# Patient Record
Sex: Female | Born: 1950 | Race: Black or African American | Hispanic: No | Marital: Married | Smoking: Never smoker
Health system: Southern US, Community
[De-identification: ages and names within clinical notes are randomized; demographics above are authoritative.]

## PROBLEM LIST (undated history)

## (undated) DIAGNOSIS — K219 Gastro-esophageal reflux disease without esophagitis: Secondary | ICD-10-CM

## (undated) DIAGNOSIS — R011 Cardiac murmur, unspecified: Secondary | ICD-10-CM

## (undated) DIAGNOSIS — E785 Hyperlipidemia, unspecified: Secondary | ICD-10-CM

## (undated) DIAGNOSIS — E119 Type 2 diabetes mellitus without complications: Secondary | ICD-10-CM

## (undated) DIAGNOSIS — D649 Anemia, unspecified: Secondary | ICD-10-CM

## (undated) DIAGNOSIS — K635 Polyp of colon: Secondary | ICD-10-CM

## (undated) DIAGNOSIS — R519 Headache, unspecified: Secondary | ICD-10-CM

## (undated) DIAGNOSIS — Z87442 Personal history of urinary calculi: Secondary | ICD-10-CM

## (undated) DIAGNOSIS — E213 Hyperparathyroidism, unspecified: Secondary | ICD-10-CM

## (undated) DIAGNOSIS — M1990 Unspecified osteoarthritis, unspecified site: Secondary | ICD-10-CM

## (undated) DIAGNOSIS — I499 Cardiac arrhythmia, unspecified: Secondary | ICD-10-CM

## (undated) DIAGNOSIS — R51 Headache: Secondary | ICD-10-CM

## (undated) DIAGNOSIS — I509 Heart failure, unspecified: Secondary | ICD-10-CM

## (undated) DIAGNOSIS — I10 Essential (primary) hypertension: Secondary | ICD-10-CM

## (undated) DIAGNOSIS — G2581 Restless legs syndrome: Secondary | ICD-10-CM

## (undated) DIAGNOSIS — Z8489 Family history of other specified conditions: Secondary | ICD-10-CM

## (undated) HISTORY — DX: Unspecified osteoarthritis, unspecified site: M19.90

## (undated) HISTORY — PX: TUBAL LIGATION: SHX77

## (undated) HISTORY — DX: Hyperlipidemia, unspecified: E78.5

## (undated) HISTORY — PX: COLONOSCOPY: SHX5424

## (undated) HISTORY — DX: Polyp of colon: K63.5

## (undated) HISTORY — DX: Gastro-esophageal reflux disease without esophagitis: K21.9

## (undated) HISTORY — PX: OTHER SURGICAL HISTORY: SHX169

## (undated) HISTORY — PX: COLONOSCOPY: SHX174

## (undated) HISTORY — DX: Essential (primary) hypertension: I10

---

## 1997-10-02 ENCOUNTER — Encounter: Admission: RE | Admit: 1997-10-02 | Discharge: 1997-10-02 | Payer: Self-pay | Admitting: Family Medicine

## 1997-10-05 ENCOUNTER — Encounter: Admission: RE | Admit: 1997-10-05 | Discharge: 1997-10-05 | Payer: Self-pay | Admitting: Family Medicine

## 1998-02-01 ENCOUNTER — Encounter: Admission: RE | Admit: 1998-02-01 | Discharge: 1998-02-01 | Payer: Self-pay | Admitting: Family Medicine

## 1998-02-08 ENCOUNTER — Encounter: Admission: RE | Admit: 1998-02-08 | Discharge: 1998-02-08 | Payer: Self-pay | Admitting: Family Medicine

## 1998-02-16 ENCOUNTER — Encounter: Admission: RE | Admit: 1998-02-16 | Discharge: 1998-02-16 | Payer: Self-pay | Admitting: Sports Medicine

## 1998-02-18 ENCOUNTER — Encounter: Admission: RE | Admit: 1998-02-18 | Discharge: 1998-02-18 | Payer: Self-pay | Admitting: Family Medicine

## 1998-03-16 ENCOUNTER — Encounter: Admission: RE | Admit: 1998-03-16 | Discharge: 1998-03-16 | Payer: Self-pay | Admitting: Family Medicine

## 2000-05-31 ENCOUNTER — Encounter: Payer: Self-pay | Admitting: Internal Medicine

## 2003-10-12 ENCOUNTER — Encounter: Payer: Self-pay | Admitting: Internal Medicine

## 2003-10-12 LAB — CONVERTED CEMR LAB

## 2003-12-21 LAB — FECAL OCCULT BLOOD, GUAIAC: Fecal Occult Blood: NEGATIVE

## 2003-12-23 ENCOUNTER — Encounter: Admission: RE | Admit: 2003-12-23 | Discharge: 2003-12-23 | Payer: Self-pay | Admitting: Internal Medicine

## 2004-12-23 ENCOUNTER — Ambulatory Visit: Payer: Self-pay | Admitting: Family Medicine

## 2004-12-30 ENCOUNTER — Ambulatory Visit: Payer: Self-pay | Admitting: Internal Medicine

## 2005-07-06 ENCOUNTER — Ambulatory Visit: Payer: Self-pay | Admitting: Internal Medicine

## 2005-07-07 ENCOUNTER — Ambulatory Visit: Payer: Self-pay | Admitting: Internal Medicine

## 2005-07-17 ENCOUNTER — Ambulatory Visit: Payer: Self-pay | Admitting: Internal Medicine

## 2005-07-20 ENCOUNTER — Ambulatory Visit: Payer: Self-pay | Admitting: Internal Medicine

## 2006-02-22 ENCOUNTER — Ambulatory Visit: Payer: Self-pay | Admitting: Internal Medicine

## 2006-02-22 LAB — CONVERTED CEMR LAB: Hgb A1c MFr Bld: 6.2 %

## 2006-03-01 ENCOUNTER — Ambulatory Visit (HOSPITAL_COMMUNITY): Admission: RE | Admit: 2006-03-01 | Discharge: 2006-03-01 | Payer: Self-pay | Admitting: Internal Medicine

## 2006-03-21 ENCOUNTER — Ambulatory Visit: Payer: Self-pay | Admitting: Gastroenterology

## 2006-03-30 ENCOUNTER — Ambulatory Visit: Payer: Self-pay | Admitting: Gastroenterology

## 2006-03-30 ENCOUNTER — Encounter (INDEPENDENT_AMBULATORY_CARE_PROVIDER_SITE_OTHER): Payer: Self-pay | Admitting: Specialist

## 2006-03-30 LAB — HM COLONOSCOPY: HM Colonoscopy: ABNORMAL

## 2006-10-01 ENCOUNTER — Encounter: Payer: Self-pay | Admitting: Internal Medicine

## 2006-10-01 DIAGNOSIS — E785 Hyperlipidemia, unspecified: Secondary | ICD-10-CM | POA: Insufficient documentation

## 2006-10-01 DIAGNOSIS — I10 Essential (primary) hypertension: Secondary | ICD-10-CM

## 2006-10-01 DIAGNOSIS — I059 Rheumatic mitral valve disease, unspecified: Secondary | ICD-10-CM | POA: Insufficient documentation

## 2006-10-01 DIAGNOSIS — M1990 Unspecified osteoarthritis, unspecified site: Secondary | ICD-10-CM | POA: Insufficient documentation

## 2006-10-04 ENCOUNTER — Ambulatory Visit: Payer: Self-pay | Admitting: Internal Medicine

## 2006-10-04 LAB — CONVERTED CEMR LAB
Albumin: 3.7 g/dL (ref 3.5–5.2)
BUN: 15 mg/dL (ref 6–23)
CO2: 31 meq/L (ref 19–32)
Calcium: 10.2 mg/dL (ref 8.4–10.5)
Chloride: 104 meq/L (ref 96–112)
Creatinine, Ser: 0.9 mg/dL (ref 0.4–1.2)
GFR calc Af Amer: 84 mL/min
GFR calc non Af Amer: 69 mL/min
Glucose, Bld: 104 mg/dL — ABNORMAL HIGH (ref 70–99)
Magnesium: 2 mg/dL (ref 1.5–2.5)
Phosphorus: 2.9 mg/dL (ref 2.3–4.6)
Potassium: 4.5 meq/L (ref 3.5–5.1)
Sodium: 140 meq/L (ref 135–145)

## 2007-02-21 ENCOUNTER — Telehealth (INDEPENDENT_AMBULATORY_CARE_PROVIDER_SITE_OTHER): Payer: Self-pay | Admitting: *Deleted

## 2007-08-28 ENCOUNTER — Telehealth (INDEPENDENT_AMBULATORY_CARE_PROVIDER_SITE_OTHER): Payer: Self-pay | Admitting: *Deleted

## 2007-08-29 ENCOUNTER — Ambulatory Visit: Payer: Self-pay | Admitting: Internal Medicine

## 2007-09-02 LAB — CONVERTED CEMR LAB
ALT: 23 units/L (ref 0–35)
AST: 23 units/L (ref 0–37)
Albumin: 3.9 g/dL (ref 3.5–5.2)
Alkaline Phosphatase: 67 units/L (ref 39–117)
BUN: 14 mg/dL (ref 6–23)
Basophils Absolute: 0.1 10*3/uL (ref 0.0–0.1)
Basophils Relative: 1.1 % — ABNORMAL HIGH (ref 0.0–1.0)
Bilirubin, Direct: 0.1 mg/dL (ref 0.0–0.3)
CO2: 31 meq/L (ref 19–32)
Calcium: 10.3 mg/dL (ref 8.4–10.5)
Chloride: 103 meq/L (ref 96–112)
Cholesterol: 227 mg/dL (ref 0–200)
Creatinine, Ser: 1 mg/dL (ref 0.4–1.2)
Direct LDL: 154.5 mg/dL
Eosinophils Absolute: 0.2 10*3/uL (ref 0.0–0.7)
Eosinophils Relative: 4 % (ref 0.0–5.0)
GFR calc Af Amer: 74 mL/min
GFR calc non Af Amer: 61 mL/min
Glucose, Bld: 85 mg/dL (ref 70–99)
HCT: 33.4 % — ABNORMAL LOW (ref 36.0–46.0)
HDL: 47.3 mg/dL (ref >39.0)
Hemoglobin: 11.7 g/dL — ABNORMAL LOW (ref 12.0–15.0)
Lymphocytes Relative: 29.6 % (ref 12.0–46.0)
MCHC: 35.1 g/dL (ref 30.0–36.0)
MCV: 89.7 fL (ref 78.0–100.0)
Monocytes Absolute: 0.5 10*3/uL (ref 0.1–1.0)
Monocytes Relative: 9.3 % (ref 3.0–12.0)
Neutro Abs: 2.6 10*3/uL (ref 1.4–7.7)
Neutrophils Relative %: 56 % (ref 43.0–77.0)
Phosphorus: 2.7 mg/dL (ref 2.3–4.6)
Platelets: 238 10*3/uL (ref 150–400)
Potassium: 3.9 meq/L (ref 3.5–5.1)
RBC: 3.72 M/uL — ABNORMAL LOW (ref 3.87–5.11)
RDW: 13.2 % (ref 11.5–14.6)
Sodium: 140 meq/L (ref 135–145)
TSH: 0.62 microintl units/mL (ref 0.35–5.50)
Total Bilirubin: 0.9 mg/dL (ref 0.3–1.2)
Total CHOL/HDL Ratio: 4.8
Total Protein: 7.1 g/dL (ref 6.0–8.3)
Triglycerides: 99 mg/dL (ref 0–149)
VLDL: 20 mg/dL (ref 0–40)
WBC: 4.9 10*3/uL (ref 4.5–10.5)

## 2007-09-10 ENCOUNTER — Ambulatory Visit: Payer: Self-pay

## 2007-09-10 ENCOUNTER — Encounter: Payer: Self-pay | Admitting: Internal Medicine

## 2007-09-25 ENCOUNTER — Encounter: Payer: Self-pay | Admitting: Internal Medicine

## 2007-11-27 ENCOUNTER — Ambulatory Visit: Payer: Self-pay | Admitting: Internal Medicine

## 2007-11-27 DIAGNOSIS — D649 Anemia, unspecified: Secondary | ICD-10-CM | POA: Insufficient documentation

## 2007-11-27 LAB — CONVERTED CEMR LAB
Basophils Absolute: 0 10*3/uL (ref 0.0–0.1)
Basophils Relative: 0.7 % (ref 0.0–3.0)
Eosinophils Absolute: 0.2 10*3/uL (ref 0.0–0.7)
Eosinophils Relative: 4.6 % (ref 0.0–5.0)
HCT: 35.6 % — ABNORMAL LOW (ref 36.0–46.0)
Hemoglobin: 12.4 g/dL (ref 12.0–15.0)
Lymphocytes Relative: 37.8 % (ref 12.0–46.0)
MCHC: 34.8 g/dL (ref 30.0–36.0)
MCV: 89.7 fL (ref 78.0–100.0)
Monocytes Absolute: 0.5 10*3/uL (ref 0.1–1.0)
Monocytes Relative: 10.2 % (ref 3.0–12.0)
Neutro Abs: 2.2 10*3/uL (ref 1.4–7.7)
Neutrophils Relative %: 46.7 % (ref 43.0–77.0)
Platelets: 278 10*3/uL (ref 150–400)
RBC: 3.97 M/uL (ref 3.87–5.11)
RDW: 13.3 % (ref 11.5–14.6)
WBC: 4.6 10*3/uL (ref 4.5–10.5)

## 2008-01-24 ENCOUNTER — Ambulatory Visit (HOSPITAL_COMMUNITY): Admission: RE | Admit: 2008-01-24 | Discharge: 2008-01-24 | Payer: Self-pay | Admitting: Internal Medicine

## 2008-01-24 LAB — HM MAMMOGRAPHY: HM Mammogram: NORMAL

## 2008-01-28 ENCOUNTER — Encounter (INDEPENDENT_AMBULATORY_CARE_PROVIDER_SITE_OTHER): Payer: Self-pay | Admitting: *Deleted

## 2009-01-19 ENCOUNTER — Ambulatory Visit: Payer: Self-pay | Admitting: Internal Medicine

## 2009-01-21 LAB — CONVERTED CEMR LAB
ALT: 23 units/L (ref 0–35)
AST: 21 units/L (ref 0–37)
Albumin: 4 g/dL (ref 3.5–5.2)
Alkaline Phosphatase: 65 units/L (ref 39–117)
BUN: 10 mg/dL (ref 6–23)
Basophils Absolute: 0 10*3/uL (ref 0.0–0.1)
Basophils Relative: 1 % (ref 0.0–3.0)
Bilirubin, Direct: 0.1 mg/dL (ref 0.0–0.3)
CO2: 30 meq/L (ref 19–32)
Calcium: 10.6 mg/dL — ABNORMAL HIGH (ref 8.4–10.5)
Chloride: 103 meq/L (ref 96–112)
Creatinine, Ser: 0.9 mg/dL (ref 0.4–1.2)
Eosinophils Absolute: 0.2 10*3/uL (ref 0.0–0.7)
Eosinophils Relative: 5.3 % — ABNORMAL HIGH (ref 0.0–5.0)
GFR calc non Af Amer: 82.8 mL/min (ref >60)
Glucose, Bld: 158 mg/dL — ABNORMAL HIGH (ref 70–99)
HCT: 36.2 % (ref 36.0–46.0)
Hemoglobin: 12.2 g/dL (ref 12.0–15.0)
Lymphocytes Relative: 29.9 % (ref 12.0–46.0)
Lymphs Abs: 1.2 10*3/uL (ref 0.7–4.0)
MCHC: 33.8 g/dL (ref 30.0–36.0)
MCV: 91.8 fL (ref 78.0–100.0)
Monocytes Absolute: 0.4 10*3/uL (ref 0.1–1.0)
Monocytes Relative: 9.3 % (ref 3.0–12.0)
Neutro Abs: 2.3 10*3/uL (ref 1.4–7.7)
Neutrophils Relative %: 54.5 % (ref 43.0–77.0)
Phosphorus: 3.3 mg/dL (ref 2.3–4.6)
Platelets: 223 10*3/uL (ref 150.0–400.0)
Potassium: 4.2 meq/L (ref 3.5–5.1)
RBC: 3.94 M/uL (ref 3.87–5.11)
RDW: 13.7 % (ref 11.5–14.6)
Sodium: 140 meq/L (ref 135–145)
TSH: 0.76 microintl units/mL (ref 0.35–5.50)
Total Bilirubin: 1 mg/dL (ref 0.3–1.2)
Total Protein: 7.4 g/dL (ref 6.0–8.3)
WBC: 4.1 10*3/uL — ABNORMAL LOW (ref 4.5–10.5)

## 2009-01-22 ENCOUNTER — Ambulatory Visit: Payer: Self-pay | Admitting: Internal Medicine

## 2009-01-25 LAB — CONVERTED CEMR LAB
Calcium, Total (PTH): 10.3 mg/dL (ref 8.4–10.5)
PTH: 145.3 pg/mL — ABNORMAL HIGH (ref 14.0–72.0)

## 2009-01-26 LAB — CONVERTED CEMR LAB
Glucose, Bld: 107 mg/dL — ABNORMAL HIGH (ref 70–99)
Hgb A1c MFr Bld: 6.3 % (ref 4.6–6.5)

## 2009-02-12 ENCOUNTER — Ambulatory Visit: Payer: Self-pay | Admitting: Internal Medicine

## 2009-02-12 DIAGNOSIS — E210 Primary hyperparathyroidism: Secondary | ICD-10-CM

## 2009-02-17 ENCOUNTER — Ambulatory Visit: Payer: Self-pay | Admitting: Internal Medicine

## 2009-02-17 ENCOUNTER — Encounter: Payer: Self-pay | Admitting: Internal Medicine

## 2009-11-08 ENCOUNTER — Ambulatory Visit: Payer: Self-pay | Admitting: Internal Medicine

## 2009-11-09 ENCOUNTER — Telehealth: Payer: Self-pay | Admitting: Internal Medicine

## 2010-04-14 NOTE — Assessment & Plan Note (Signed)
Summary: blood pressure/alc   Vital Signs:  Patient profile:   60 year old female Weight:      183 pounds BMI:     33.06 Temp:     98.6 degrees F oral Pulse rate:   60 / minute Pulse rhythm:   regular BP sitting:   120 / 60  (left arm) Cuff size:   regular  Vitals Entered By: Mervin Hack CMA Duncan Dull) (November 08, 2009 4:39 PM) CC: pain in the back of head   History of Present Illness: Doing okay No new concerns except she gets the sense of her heart beating on side of head---right temple Occurs if she is rushing around No headaches No vfision change no pain during chewing  No palpitations in chest (unless she misses BP meds) no skips now Occ tingling sensation in chest--"like sticking with a needle" No SOB  No edema  Pain in left shoulder some decreased abduction tylenol and advil help  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 Lisinopril (Lisinopril)  Past History:  Past medical, surgical, family and social histories (including risk factors) reviewed for relevance to current acute and chronic problems.  Past Medical History: Reviewed history from 10/04/2006 and no changes required. Hyperlipidemia Hypertension Mitral valve prolapse Glucose intolerance Osteoarthritis Hyperplastic colon polyp  Past Surgical History: Reviewed history from 11/27/2007 and no changes required. Denies surgical history  Family History: Father: Died age age 46, DM, HTN Mother: Alive with HTN Siblings: 6 Brothers, 2 alive DM. 3 Sisters, one alive DM 1 brother bled from cerebral aneurysm CAD on both sides HTN also DM in Dad/ daughter, sibs. No breast or colon cancer  Social History: Reviewed history from 10/01/2006 and no changes required. Marital Status: Married Children: 3 Occupation: Leisure centre manager houses Never Smoked Alcohol use-no  Review of Systems       hearing seems okay in right ear no tinnitus  Physical Exam  General:  alert and normal  appearance.   Head:  no temporal bruits normocephalic and atraumatic.   Ears:  R ear normal and L ear normal.   Neck:  supple, no masses, no thyromegaly, no carotid bruits, and no cervical lymphadenopathy.   Lungs:  normal respiratory effort, no intercostal retractions, no accessory muscle use, and normal breath sounds.   Heart:  normal rate, regular rhythm, no murmur, and no gallop.   Extremities:  no edema Psych:  normally interactive, good eye contact, not anxious appearing, and not depressed appearing.     Impression & Recommendations:  Problem # 1:  PALPITATIONS (ICD-785.1) Assessment Comment Only feels it in head will research the issue of familial aneurysm and whether screening is appropriate  Her updated medication list for this problem includes:    Atenolol 100 Mg Tabs (Atenolol) .Marland Kitchen... Take one by mouth daily  Problem # 2:  OSTEOARTHRITIS (ICD-715.90) Assessment: Unchanged OTC meds help  Her updated medication list for this problem includes:    Ibuprofen 400 Mg Tabs (Ibuprofen) .Marland Kitchen... Take one by mouth three times a day as needed  Problem # 3:  HYPERTENSION (ICD-401.9) Assessment: Unchanged good control no changes needed  Her updated medication list for this problem includes:    Atenolol 100 Mg Tabs (Atenolol) .Marland Kitchen... Take one by mouth daily    Triamterene-hctz 37.5-25 Mg Tabs (Triamterene-hctz) .Marland Kitchen... Take one by mouth daily  BP today: 120/60 Prior BP: 110/70 (02/12/2009)  Labs Reviewed: K+: 4.2 (01/19/2009) Creat: : 0.9 (01/19/2009)   Chol: 227 (08/29/2007)  HDL: 47.3 (08/29/2007)   LDL: DEL (08/29/2007)   TG: 99 (08/29/2007)  Complete Medication List: 1)  Atenolol 100 Mg Tabs (Atenolol) .... Take one by mouth daily 2)  Triamterene-hctz 37.5-25 Mg Tabs (Triamterene-hctz) .... Take one by mouth daily 3)  Ibuprofen 400 Mg Tabs (Ibuprofen) .... Take one by mouth three times a day as needed  Patient Instructions: 1)  Please schedule a follow-up appointment in 6  months for physical  Current Allergies (reviewed today): ! LISINOPRIL (LISINOPRIL)

## 2010-04-14 NOTE — Progress Notes (Signed)
Summary: Questions  Phone Note Outgoing Call   Summary of Call: Please call patient Her symptoms of the pounding are not considered a symptom of an unruptured aneurysm in the brain.  Having one family member with an aneurysm is not considered a high enough risk to screen for aneurysms. Only if there are 2 or more family members is the risk considered to be high enough. Having surgery for an aneurysm is fairly risky itself, so screening is only recommended in the highest risk situations. The symptoms of an aneurysm are generaly a severe headache, or sometimes neurologic symptoms like facial weakness or change in vision Initial call taken by: Cindee Salt MD,  November 09, 2009 8:28 AM  Follow-up for Phone Call        left message on machine at home for patient to return my call.  DeShannon Smith CMA [facility] Dull)  November 09, 2009 11:58 AM   left message on machine at home for patient to return my call.  work number is wrong number. looked in [employer] and found a cell number but the voicemail has not been set up yet. DeShannon Smith CMA [facility] Dull)  November 10, 2009 8:51 AM   pt returned my call and left message for me to call her back, message was left at 9:12am, pt stated on message I needed to call her back before 9:30. I called again and a female family member answered and he told me to call back around 1:00pm. DeShannon Smith CMA [facility] Dull)  November 10, 2009 10:57 AM   left message on machine at home for patient to return my call.  DeShannon Smith CMA (AAMA)  November 10, 2009 1:22 PM   Patient called back and I advised results. She completely understood. Follow-up by: Mervin Hack CMA (AAMA),  November 11, 2009 8:16 AM

## 2010-05-10 ENCOUNTER — Other Ambulatory Visit: Payer: Self-pay | Admitting: Internal Medicine

## 2010-05-10 ENCOUNTER — Ambulatory Visit (INDEPENDENT_AMBULATORY_CARE_PROVIDER_SITE_OTHER): Payer: Self-pay | Admitting: Internal Medicine

## 2010-05-10 ENCOUNTER — Encounter: Payer: Self-pay | Admitting: Internal Medicine

## 2010-05-10 DIAGNOSIS — R079 Chest pain, unspecified: Secondary | ICD-10-CM

## 2010-05-10 LAB — RENAL FUNCTION PANEL
Albumin: 3.9 g/dL (ref 3.5–5.2)
BUN: 16 mg/dL (ref 6–23)
CO2: 30 mEq/L (ref 19–32)
Calcium: 10.5 mg/dL (ref 8.4–10.5)
Chloride: 102 mEq/L (ref 96–112)
Creatinine, Ser: 1 mg/dL (ref 0.4–1.2)
GFR: 73.84 mL/min (ref >60.00)
Glucose, Bld: 134 mg/dL — ABNORMAL HIGH (ref 70–99)
Phosphorus: 2.9 mg/dL (ref 2.3–4.6)
Potassium: 4.1 mEq/L (ref 3.5–5.1)
Sodium: 138 mEq/L (ref 135–145)

## 2010-05-10 LAB — CBC WITH DIFFERENTIAL/PLATELET
Basophils Absolute: 0 10*3/uL (ref 0.0–0.1)
Basophils Relative: 0.6 % (ref 0.0–3.0)
Eosinophils Absolute: 0.2 10*3/uL (ref 0.0–0.7)
Eosinophils Relative: 4.7 % (ref 0.0–5.0)
HCT: 35.4 % — ABNORMAL LOW (ref 36.0–46.0)
Hemoglobin: 12.2 g/dL (ref 12.0–15.0)
Lymphocytes Relative: 28.8 % (ref 12.0–46.0)
Lymphs Abs: 1.4 10*3/uL (ref 0.7–4.0)
MCHC: 34.4 g/dL (ref 30.0–36.0)
MCV: 90.4 fl (ref 78.0–100.0)
Monocytes Absolute: 0.5 10*3/uL (ref 0.1–1.0)
Monocytes Relative: 9.9 % (ref 3.0–12.0)
Neutro Abs: 2.8 10*3/uL (ref 1.4–7.7)
Neutrophils Relative %: 56 % (ref 43.0–77.0)
Platelets: 236 10*3/uL (ref 150.0–400.0)
RBC: 3.92 Mil/uL (ref 3.87–5.11)
RDW: 14.7 % — ABNORMAL HIGH (ref 11.5–14.6)
WBC: 5 10*3/uL (ref 4.5–10.5)

## 2010-05-10 LAB — HEPATIC FUNCTION PANEL
ALT: 27 U/L (ref 0–35)
AST: 23 U/L (ref 0–37)
Albumin: 3.9 g/dL (ref 3.5–5.2)
Alkaline Phosphatase: 67 U/L (ref 39–117)
Bilirubin, Direct: 0.1 mg/dL (ref 0.0–0.3)
Total Bilirubin: 0.7 mg/dL (ref 0.3–1.2)
Total Protein: 6.7 g/dL (ref 6.0–8.3)

## 2010-05-10 LAB — TSH: TSH: 0.69 u[IU]/mL (ref 0.35–5.50)

## 2010-05-11 ENCOUNTER — Ambulatory Visit (HOSPITAL_COMMUNITY)
Admission: RE | Admit: 2010-05-11 | Discharge: 2010-05-11 | Disposition: A | Discharge status: Home / Self Care | Payer: Self-pay | Source: Ambulatory Visit | Attending: Internal Medicine | Admitting: Internal Medicine

## 2010-05-11 DIAGNOSIS — R109 Unspecified abdominal pain: Secondary | ICD-10-CM | POA: Insufficient documentation

## 2010-05-11 DIAGNOSIS — R079 Chest pain, unspecified: Secondary | ICD-10-CM | POA: Insufficient documentation

## 2010-05-19 NOTE — Assessment & Plan Note (Signed)
Summary: heartburn/alc   Vital Signs:  Patient profile:   60 year old female Weight:      188 pounds Temp:     98.5 degrees F oral Pulse rate:   54 / minute Pulse rhythm:   regular BP sitting:   131 / 77  (left arm) Cuff size:   large  Vitals Entered By: Mervin Hack CMA [facility] Dull) (May 10, 2010 8:51 AM) CC: heartburn   History of Present Illness: Having "heartburn" for 2-3 months Gets spells all across chest and into back has also felt pressure into her face Pain comes and goes "like labor pains" Kept her up 2 nights ago  Has had  ~monthly spells usually night but one in the day Crampy type across chest and back next day will have diarrhea No fever No clear food that are related  Tried tums without any help No sig nausea this past time---did at other times. No vomiting though  Allergies: 1)  ! Lisinopril (Lisinopril)  Past History:  Past medical, surgical, family and social histories (including risk factors) reviewed for relevance to current acute and chronic problems.  Past Medical History: Reviewed history from 10/04/2006 and no changes required. Hyperlipidemia Hypertension Mitral valve prolapse Glucose intolerance Osteoarthritis Hyperplastic colon polyp  Past Surgical History: Reviewed history from 11/27/2007 and no changes required. Denies surgical history  Family History: Reviewed history from 11/08/2009 and no changes required. Father: Died age age 39, DM, HTN Mother: Alive with HTN Siblings: 6 Brothers, 2 alive DM. 3 Sisters, one alive DM 1 brother bled from cerebral aneurysm CAD on both sides HTN also DM in Dad/ daughter, sibs. No breast or colon cancer  Social History: Reviewed history from 10/01/2006 and no changes required. Marital Status: Married Children: 3 Occupation: Leisure centre manager houses Never Smoked Alcohol use-no  Review of Systems       No cough  No SOB No burning sensation  Physical Exam  General:  alert and normal  appearance.   Neck:  supple, no masses, and no cervical lymphadenopathy.   Chest Wall:  no tenderness and no mass.   Lungs:  normal respiratory effort, no intercostal retractions, no accessory muscle use, and normal breath sounds.   Heart:  normal rate, regular rhythm, no murmur, and no gallop.   Abdomen:  soft, non-tender, no masses, no hepatomegaly, and no splenomegaly.  Murphy's sign absent Extremities:  no edema Psych:  normally interactive, good eye contact, not anxious appearing, and not depressed appearing.     Impression & Recommendations:  Problem # 1:  CHEST PAIN (ICD-786.50) Assessment New  sounds like colicky type pain suspect gall bladder could be acid related though nothing to suggest cardiopulmonary etiology  P:  will check labs      check RUQ ultrasound      if gallbladder normal, will try ranitidine for a while      if abnormal, surgery in [place]  Orders: Venipuncture ([address]) TLB-Renal Function Panel (80069-RENAL) TLB-CBC Platelet - w/Differential (85025-CBCD) TLB-Hepatic/Liver Function Pnl (80076-HEPATIC) TLB-TSH (Thyroid Stimulating Hormone) (60454-UJW) Radiology Referral (Radiology)  Complete Medication List: 1)  Atenolol 100 Mg Tabs (Atenolol) .... Take one by mouth daily 2)  Triamterene-hctz 37.5-25 Mg Tabs (Triamterene-hctz) .... Take one by mouth daily  Patient Instructions: 1)  Please schedule a follow-up appointment in 3 months .  2)  Please set up gallbladder ultrasound   Orders Added: 1)  Venipuncture [36415] 2)  TLB-Renal Function Panel [80069-RENAL] 3)  TLB-CBC Platelet - w/Differential [85025-CBCD] 4)  TLB-Hepatic/Liver  Function Pnl [80076-HEPATIC] 5)  TLB-TSH (Thyroid Stimulating Hormone) [84443-TSH] 6)  Est. Patient Level IV [[address]] 7)  Radiology Referral [Radiology]    Current Allergies (reviewed today): ! LISINOPRIL (LISINOPRIL)

## 2010-07-19 ENCOUNTER — Other Ambulatory Visit: Payer: Self-pay | Admitting: *Deleted

## 2010-07-19 MED ORDER — TRIAMTERENE-HCTZ 37.5-25 MG PO TABS: 1.0000 | ORAL_TABLET | Freq: Every day | ORAL | Status: DC

## 2010-08-03 ENCOUNTER — Encounter: Payer: Self-pay | Admitting: Internal Medicine

## 2010-08-09 ENCOUNTER — Ambulatory Visit (INDEPENDENT_AMBULATORY_CARE_PROVIDER_SITE_OTHER): Payer: Self-pay | Admitting: Internal Medicine

## 2010-08-09 ENCOUNTER — Encounter: Payer: Self-pay | Admitting: Internal Medicine

## 2010-08-09 DIAGNOSIS — I10 Essential (primary) hypertension: Secondary | ICD-10-CM

## 2010-08-09 DIAGNOSIS — Z0289 Encounter for other administrative examinations: Secondary | ICD-10-CM

## 2010-08-10 NOTE — Progress Notes (Signed)
  Subjective:    Patient ID: Sandra [name], female    DOB: [birth year], 60 y.o.   MRN: [id number]  HPI  No show for appt  Review of Systems     Objective:   Physical Exam        Assessment & Plan:

## 2010-08-10 NOTE — Progress Notes (Signed)
No show visit This needs to be cancelled

## 2010-09-27 ENCOUNTER — Other Ambulatory Visit: Payer: Self-pay | Admitting: *Deleted

## 2010-09-27 MED ORDER — ATENOLOL 100 MG PO TABS: 100.0000 mg | ORAL_TABLET | Freq: Every day | ORAL | Status: DC

## 2010-09-27 NOTE — Telephone Encounter (Signed)
rx sent to pharmacy by e-script  

## 2010-10-17 ENCOUNTER — Other Ambulatory Visit: Payer: Self-pay | Admitting: *Deleted

## 2010-10-17 MED ORDER — TRIAMTERENE-HCTZ 37.5-25 MG PO TABS: 1.0000 | ORAL_TABLET | Freq: Every day | ORAL | Status: DC

## 2010-10-28 ENCOUNTER — Other Ambulatory Visit: Payer: Self-pay | Admitting: *Deleted

## 2010-10-28 MED ORDER — ATENOLOL 100 MG PO TABS: 100.0000 mg | ORAL_TABLET | Freq: Every day | ORAL | Status: DC

## 2011-06-05 ENCOUNTER — Encounter: Payer: Self-pay | Admitting: Internal Medicine

## 2011-06-05 ENCOUNTER — Ambulatory Visit (INDEPENDENT_AMBULATORY_CARE_PROVIDER_SITE_OTHER): Payer: Self-pay | Admitting: Internal Medicine

## 2011-06-05 VITALS — BP 120/80 | HR 59 | Temp 98.4°F | Ht 62.0 in | Wt 186.0 lb

## 2011-06-05 DIAGNOSIS — M23302 Other meniscus derangements, unspecified lateral meniscus, unspecified knee: Secondary | ICD-10-CM

## 2011-06-05 DIAGNOSIS — I10 Essential (primary) hypertension: Secondary | ICD-10-CM

## 2011-06-05 DIAGNOSIS — R0789 Other chest pain: Secondary | ICD-10-CM | POA: Insufficient documentation

## 2011-06-05 NOTE — Assessment & Plan Note (Signed)
Seems to be chest wall related No action needed

## 2011-06-05 NOTE — Assessment & Plan Note (Signed)
Right knee Symptoms are mild Discussed continuing the tylenol and using elastic brace To ortho if worsens

## 2011-06-05 NOTE — Progress Notes (Signed)
  Subjective:    Patient ID: Sandra [name], female    DOB: [birth year], 61 y.o.   MRN: [id number]  HPI Having trouble with right knee Lots of pain Has swollen quite a bit Doesn't remember any injury Swelling posteriorly if she walks for a while Pain when she kneels  Started 5-6 days ago Has tried multiple OTC meds-- aleve and advil no help (they put her to sleep) Tylenol arthritis helps some Using compression wrap also  BP has been okay Checks at pharmacy--usually ~120/80 No headaches Some chest pressure since last week also--"like something laying on top of " sternum Stopped this week Mostly at night in bed---better if she turned onto her stomach No SOB, nausea or diaphoresis with this May last 10 minutes  Current Outpatient Prescriptions on File Prior to Visit  Medication Sig Dispense Refill  . atenolol (TENORMIN) 100 MG tablet Take 1 tablet (100 mg total) by mouth daily.  30 tablet  11  . triamterene-hydrochlorothiazide (MAXZIDE-25) 37.5-25 MG per tablet Take 1 tablet by mouth daily.  90 tablet  1    Allergies  Allergen Reactions  . Lisinopril     REACTION: Angioedema    Past Medical History  Diagnosis Date  . Hyperlipidemia   . Hypertension   . Arthritis   . Mitral valve prolapse   . Glucose intolerance (impaired glucose tolerance)   . Hyperplastic colon polyp     No past surgical history on file.  Family History  Problem Relation Age of Onset  . Hypertension Mother   . Diabetes Father   . Hypertension Father   . Cancer Neg Hx     no breast or colon cancer  . Diabetes Brother   . Diabetes Brother   . Diabetes Sister     History   Social History  . Marital Status: Married    Spouse Name: N/A    Number of Children: 3  . Years of Education: N/A   Occupational History  . clean houses    Social History Main Topics  . Smoking status: Never Smoker   . Smokeless tobacco: Never Used  . Alcohol Use: No  . Drug Use: Not on file  . Sexually Active:  Not on file   Other Topics Concern  . Not on file   Social History Narrative  . No narrative on file   Review of Systems Chronic sleep problem--no change Stays active cleaning houses---no chest pain or easy fatigue with this     Objective:   Physical Exam  Constitutional: She appears well-developed and well-nourished. No distress.  Neck: Normal range of motion. Neck supple. No thyromegaly present.  Cardiovascular: Normal rate, regular rhythm and normal heart sounds.  Exam reveals no gallop and no friction rub.   No murmur heard. Pulmonary/Chest: Effort normal and breath sounds normal. No respiratory distress. She has no wheezes. She has no rales. She exhibits tenderness.       Sternal pressure recreates the chest pain  Musculoskeletal: She exhibits no edema.       Mild effusion in right knee No ligament instability Macmurray's positive with lateral twist---medial negative Normal ROM  Lymphadenopathy:    She has no cervical adenopathy.  Psychiatric: She has a normal mood and affect. Her behavior is normal.          Assessment & Plan:

## 2011-06-05 NOTE — Assessment & Plan Note (Signed)
BP Readings from Last 3 Encounters:  06/05/11 120/80  05/10/10 131/77  11/08/09 120/60   Control is fine Due for labs

## 2011-06-05 NOTE — Patient Instructions (Signed)
Please continue the tylenol arthritis and try a compression brace for your knee. Call for orthopedic referral if worsens

## 2011-06-06 LAB — CBC WITH DIFFERENTIAL/PLATELET
Basophils Absolute: 0.1 10*3/uL (ref 0.0–0.1)
Basophils Relative: 1.1 % (ref 0.0–3.0)
Eosinophils Absolute: 0.2 10*3/uL (ref 0.0–0.7)
Eosinophils Relative: 3.8 % (ref 0.0–5.0)
HCT: 37 % (ref 36.0–46.0)
Hemoglobin: 12.6 g/dL (ref 12.0–15.0)
Lymphocytes Relative: 32.7 % (ref 12.0–46.0)
Lymphs Abs: 1.7 10*3/uL (ref 0.7–4.0)
MCHC: 33.9 g/dL (ref 30.0–36.0)
MCV: 89.8 fl (ref 78.0–100.0)
Monocytes Absolute: 0.4 10*3/uL (ref 0.1–1.0)
Monocytes Relative: 7.4 % (ref 3.0–12.0)
Neutro Abs: 2.9 10*3/uL (ref 1.4–7.7)
Neutrophils Relative %: 55 % (ref 43.0–77.0)
Platelets: 242 10*3/uL (ref 150.0–400.0)
RBC: 4.12 Mil/uL (ref 3.87–5.11)
RDW: 14.1 % (ref 11.5–14.6)
WBC: 5.3 10*3/uL (ref 4.5–10.5)

## 2011-06-06 LAB — BASIC METABOLIC PANEL
BUN: 14 mg/dL (ref 6–23)
CO2: 27 mEq/L (ref 19–32)
Calcium: 10.9 mg/dL — ABNORMAL HIGH (ref 8.4–10.5)
Chloride: 102 mEq/L (ref 96–112)
Creatinine, Ser: 0.9 mg/dL (ref 0.4–1.2)
GFR: 87.73 mL/min (ref >60.00)
Glucose, Bld: 92 mg/dL (ref 70–99)
Potassium: 4 mEq/L (ref 3.5–5.1)
Sodium: 138 mEq/L (ref 135–145)

## 2011-06-06 LAB — LIPID PANEL
Cholesterol: 222 mg/dL — ABNORMAL HIGH (ref 0–200)
HDL: 51.4 mg/dL
Total CHOL/HDL Ratio: 4
Triglycerides: 144 mg/dL (ref 0.0–149.0)
VLDL: 28.8 mg/dL (ref 0.0–40.0)

## 2011-06-06 LAB — HEPATIC FUNCTION PANEL
ALT: 24 U/L (ref 0–35)
AST: 25 U/L (ref 0–37)
Albumin: 4.1 g/dL (ref 3.5–5.2)
Alkaline Phosphatase: 64 U/L (ref 39–117)
Bilirubin, Direct: 0.1 mg/dL (ref 0.0–0.3)
Total Bilirubin: 0.6 mg/dL (ref 0.3–1.2)
Total Protein: 7.7 g/dL (ref 6.0–8.3)

## 2011-06-06 LAB — LDL CHOLESTEROL, DIRECT: Direct LDL: 154.3 mg/dL

## 2011-06-06 LAB — TSH: TSH: 0.51 u[IU]/mL (ref 0.35–5.50)

## 2011-06-07 ENCOUNTER — Encounter: Payer: Self-pay | Admitting: *Deleted

## 2011-08-30 ENCOUNTER — Ambulatory Visit: Payer: Self-pay | Admitting: Family Medicine

## 2011-09-07 ENCOUNTER — Ambulatory Visit (INDEPENDENT_AMBULATORY_CARE_PROVIDER_SITE_OTHER): Payer: Self-pay | Admitting: Family Medicine

## 2011-09-07 ENCOUNTER — Encounter: Payer: Self-pay | Admitting: Family Medicine

## 2011-09-07 VITALS — BP 130/78 | HR 68 | Temp 98.4°F | Ht 62.5 in | Wt 185.8 lb

## 2011-09-07 DIAGNOSIS — M7061 Trochanteric bursitis, right hip: Secondary | ICD-10-CM

## 2011-09-07 DIAGNOSIS — M76899 Other specified enthesopathies of unspecified lower limb, excluding foot: Secondary | ICD-10-CM

## 2011-09-07 NOTE — Progress Notes (Signed)
   Nature conservation officer at [facility] [address] [place] [address] Phone: [phone] Fax: [phone]   Patient Name: Sandra [name] Date of Birth: November 24, 1950 Age: 61 y.o. Medical Record Number: [id number] Gender: female Date of Encounter: 09/07/2011  History of Present Illness:  Sandra [name] is a 61 y.o. very pleasant female patient who presents with the following:  R hip, will start in the hip and will start hurting in the leg and also mostly laterally on the R side.  The pain is all on her lateral RIGHT hip mostly around her trochanteric bursa, and it will radiate some on only a ladder last checked a little bit beyond this distally. No pain in her back. No pain in her posterior buttocks. No pain in hergroin. She is not having any numbness, tingling, or any weakness or other radicular symptoms.  Knee not as bad as it was. The patient saw Dr. Alphonsus Sias a few months ago, and her RIGHT knee was bothering her, but now it really is not.   Past Medical History, Surgical History, Social History, Family History, Problem List, Medications, and Allergies have been reviewed and updated if relevant.  Prior to Admission medications   Medication Sig Start Date End Date Taking? Authorizing Provider  atenolol (TENORMIN) 100 MG tablet Take 1 tablet (100 mg total) by mouth daily. 10/28/10   Karie Schwalbe, MD  triamterene-hydrochlorothiazide (MAXZIDE-25) 37.5-25 MG per tablet Take 1 tablet by mouth daily. 10/17/10 10/17/11  Karie Schwalbe, MD    Review of Systems:  GEN: No fevers, chills. Nontoxic. Primarily MSK c/o today. MSK: Detailed in the HPI GI: tolerating PO intake without difficulty Neuro: No numbness, parasthesias, or tingling associated. Otherwise the pertinent positives of the ROS are noted above.    Physical Examination: Filed Vitals:   09/07/11 0815  BP: 130/78  Pulse: 68  Temp: 98.4 F (36.9 C)   Filed Vitals:   09/07/11 0815  Height: 5' 2.5" (1.588 m)  Weight:  185 lb 12.8 oz (84.278 kg)   Body mass index is 33.44 kg/(m^2). Ideal Body Weight: Weight in (lb) to have BMI = 25: 138.6    GEN: WDWN, NAD, Non-toxic, Alert & Oriented x 3 HEENT: Atraumatic, Normocephalic.  Ears and Nose: No external deformity. EXTR: No clubbing/cyanosis/edema NEURO: Normal gait.  PSYCH: Normally interactive. Conversant. Not depressed or anxious appearing.  Calm demeanor.   HIP EXAM: SIDE: b ROM: Abduction, Flexion, Internal and External range of motion: full Pain with terminal IROM and EROM: none GTB: R marked pain SLR: NEG Knees: No effusion FABER: NT REVERSE FABER: NT, neg Piriformis: NT at direct palpation Str: flexion: 5/5 abduction: 4/5 on right adduction: 5/5 Strength testing non-tender     Assessment and Plan: 1. Trochanteric bursitis of right hip     I reviewed with the patient the structures involved and how they related to diagnosis.   Patient was given a rehabilitation protocol for basic str and ROM, particularly hip abductors  Trochanteric Bursitis Injection, RIGHT Verbal consent obtained. Risks (including infection, potential atrophy), benefits, and alternatives reviewed. Greater trochanter sterilely prepped with Chloraprep. Ethyl Chloride used for anesthesia. 8 cc of Lidocaine 1% injected with 2 cc of 40 mg Depo-Medrol into trochanteric bursa at area of maximal tenderness at greater trochanter. Needle taken to bone to troch bursa, flows easily. Bursa massaged. No bleeding and no complications. Decreased pain after injection. Needle: 22 gauge spinal needle   Hannah Beat, MD

## 2011-09-07 NOTE — Patient Instructions (Addendum)
Trochanteric Bursa Rehab  Stretches: Cross Leg, "sink stretch" - can do all day during work day [employer] over [employer], laying down, hold 5-10 sec x 3  Hip Abduction - build to 3 sets of 30 and then add weights begin with no weight. When 3 x 30 reached, Add 2 lb. ankle weight. Increase to 3,4,5,6 when 3x30 achieved.

## 2011-09-24 ENCOUNTER — Other Ambulatory Visit: Payer: Self-pay | Admitting: Internal Medicine

## 2011-10-20 ENCOUNTER — Other Ambulatory Visit: Payer: Self-pay | Admitting: Internal Medicine

## 2011-11-22 ENCOUNTER — Other Ambulatory Visit: Payer: Self-pay | Admitting: Internal Medicine

## 2012-01-12 ENCOUNTER — Encounter: Payer: Self-pay | Admitting: Internal Medicine

## 2012-08-10 ENCOUNTER — Encounter (HOSPITAL_COMMUNITY): Payer: Self-pay | Admitting: *Deleted

## 2012-08-10 ENCOUNTER — Emergency Department (HOSPITAL_COMMUNITY)
Admission: EM | Admit: 2012-08-10 | Discharge: 2012-08-11 | Disposition: A | Discharge status: Home / Self Care | Payer: Self-pay | Attending: Emergency Medicine | Admitting: Emergency Medicine

## 2012-08-10 DIAGNOSIS — E119 Type 2 diabetes mellitus without complications: Secondary | ICD-10-CM

## 2012-08-10 DIAGNOSIS — Z8709 Personal history of other diseases of the respiratory system: Secondary | ICD-10-CM | POA: Insufficient documentation

## 2012-08-10 DIAGNOSIS — Z85038 Personal history of other malignant neoplasm of large intestine: Secondary | ICD-10-CM | POA: Insufficient documentation

## 2012-08-10 DIAGNOSIS — R739 Hyperglycemia, unspecified: Secondary | ICD-10-CM

## 2012-08-10 DIAGNOSIS — E1169 Type 2 diabetes mellitus with other specified complication: Secondary | ICD-10-CM | POA: Insufficient documentation

## 2012-08-10 DIAGNOSIS — I10 Essential (primary) hypertension: Secondary | ICD-10-CM | POA: Insufficient documentation

## 2012-08-10 DIAGNOSIS — Z79899 Other long term (current) drug therapy: Secondary | ICD-10-CM | POA: Insufficient documentation

## 2012-08-10 DIAGNOSIS — Z8639 Personal history of other endocrine, nutritional and metabolic disease: Secondary | ICD-10-CM | POA: Insufficient documentation

## 2012-08-10 DIAGNOSIS — Z862 Personal history of diseases of the blood and blood-forming organs and certain disorders involving the immune mechanism: Secondary | ICD-10-CM | POA: Insufficient documentation

## 2012-08-10 DIAGNOSIS — Z8679 Personal history of other diseases of the circulatory system: Secondary | ICD-10-CM | POA: Insufficient documentation

## 2012-08-10 LAB — COMPREHENSIVE METABOLIC PANEL WITH GFR
ALT: 32 U/L (ref 0–35)
AST: 26 U/L (ref 0–37)
CO2: 27 meq/L (ref 19–32)
Calcium: 11.6 mg/dL — ABNORMAL HIGH (ref 8.4–10.5)
GFR calc non Af Amer: 55 mL/min — ABNORMAL LOW (ref >90)
Sodium: 128 meq/L — ABNORMAL LOW (ref 135–145)
Total Protein: 8 g/dL (ref 6.0–8.3)

## 2012-08-10 LAB — CBC WITH DIFFERENTIAL/PLATELET
Basophils Absolute: 0 K/uL (ref 0.0–0.1)
Basophils Relative: 0 % (ref 0–1)
Eosinophils Absolute: 0.2 10*3/uL (ref 0.0–0.7)
Eosinophils Relative: 3 % (ref 0–5)
HCT: 37.1 % (ref 36.0–46.0)
Hemoglobin: 13.5 g/dL (ref 12.0–15.0)
Lymphocytes Relative: 41 % (ref 12–46)
Lymphs Abs: 2.4 10*3/uL (ref 0.7–4.0)
MCH: 30.5 pg (ref 26.0–34.0)
MCHC: 36.4 g/dL — ABNORMAL HIGH (ref 30.0–36.0)
MCV: 83.9 fL (ref 78.0–100.0)
Monocytes Absolute: 0.4 10*3/uL (ref 0.1–1.0)
Monocytes Relative: 7 % (ref 3–12)
Neutro Abs: 2.8 10*3/uL (ref 1.7–7.7)
Neutrophils Relative %: 48 % (ref 43–77)
Platelets: 262 K/uL (ref 150–400)
RBC: 4.42 MIL/uL (ref 3.87–5.11)
RDW: 13.1 % (ref 11.5–15.5)
WBC: 5.7 K/uL (ref 4.0–10.5)

## 2012-08-10 LAB — GLUCOSE, CAPILLARY
Glucose-Capillary: 548 mg/dL — ABNORMAL HIGH (ref 70–99)
Glucose-Capillary: 420 mg/dL — ABNORMAL HIGH (ref 70–99)
Glucose-Capillary: 349 mg/dL — ABNORMAL HIGH (ref 70–99)
Glucose-Capillary: 313 mg/dL — ABNORMAL HIGH (ref 70–99)
Glucose-Capillary: 217 mg/dL — ABNORMAL HIGH (ref 70–99)

## 2012-08-10 LAB — URINALYSIS, ROUTINE W REFLEX MICROSCOPIC
Bilirubin Urine: NEGATIVE
Glucose, UA: >1000 mg/dL — AB
Hgb urine dipstick: NEGATIVE
Ketones, ur: 15 mg/dL — AB
Leukocytes, UA: NEGATIVE
Nitrite: NEGATIVE
Protein, ur: NEGATIVE mg/dL
Specific Gravity, Urine: 1.034 — ABNORMAL HIGH (ref 1.005–1.030)
Urobilinogen, UA: 0.2 mg/dL (ref 0.0–1.0)
pH: 6 (ref 5.0–8.0)

## 2012-08-10 LAB — COMPREHENSIVE METABOLIC PANEL
Albumin: 4.4 g/dL (ref 3.5–5.2)
Alkaline Phosphatase: 120 U/L — ABNORMAL HIGH (ref 39–117)
BUN: 21 mg/dL (ref 6–23)
Chloride: 88 mEq/L — ABNORMAL LOW (ref 96–112)
Creatinine, Ser: 1.06 mg/dL (ref 0.50–1.10)
GFR calc Af Amer: 64 mL/min — ABNORMAL LOW (ref >90)
Glucose, Bld: 593 mg/dL (ref 70–99)
Potassium: 3.8 mEq/L (ref 3.5–5.1)
Total Bilirubin: 0.8 mg/dL (ref 0.3–1.2)

## 2012-08-10 LAB — URINE MICROSCOPIC-ADD ON

## 2012-08-10 MED ORDER — SODIUM CHLORIDE 0.9 % IV SOLN
INTRAVENOUS | Status: DC
  Administered 2012-08-10: 20:00:00 via INTRAVENOUS

## 2012-08-10 MED ORDER — DEXTROSE-NACL 5-0.45 % IV SOLN: INTRAVENOUS | Status: DC

## 2012-08-10 MED ORDER — SODIUM CHLORIDE 0.9 % IV SOLN
INTRAVENOUS | Status: DC
  Administered 2012-08-10: 7.6 [IU]/h via INTRAVENOUS
  Administered 2012-08-10: 3.6 [IU]/h via INTRAVENOUS
  Administered 2012-08-10: 5.8 [IU]/h via INTRAVENOUS
  Filled 2012-08-10: qty 1

## 2012-08-10 MED ORDER — INSULIN REGULAR BOLUS VIA INFUSION
0.0000 [IU] | Freq: Three times a day (TID) | INTRAVENOUS | Status: DC
  Filled 2012-08-10: qty 10

## 2012-08-10 MED ORDER — SODIUM CHLORIDE 0.9 % IV BOLUS (SEPSIS)
1000.0000 mL | Freq: Once | INTRAVENOUS | Status: AC
  Administered 2012-08-10: 1000 mL via INTRAVENOUS

## 2012-08-10 MED ORDER — DEXTROSE 50 % IV SOLN: 25.0000 mL | INTRAVENOUS | Status: DC | PRN

## 2012-08-10 NOTE — ED Provider Notes (Signed)
History     CSN: [id number]  Arrival date & time 08/10/12  1757   First MD Initiated Contact with Patient 08/10/12 1811      Chief Complaint  Patient presents with  . freq urination     (Consider location/radiation/quality/duration/timing/severity/associated sxs/prior treatment) HPI Comments: Patient with history of hypertension, high cholesterol, "borderline diabetes" -- presents with 2 weeks of frequent urination, generalized weakness, blurred vision, and decreased energy. She denies fever, cold symptoms, chest pain, shortness of breath, abdominal pain, nausea, vomiting, diarrhea, dysuria or hematuria. Blood sugar was checked upon arrival to the emergency department and found to be 548. No treatments prior to arrival. Patient states that she has a primary care physician appointment in 2 weeks but didn't feel that she could wait that long to get rechecked. She's not currently on any diabetic medications. Onset of symptoms gradual. Course is constant. Nothing makes symptoms better or worse.  The history is provided by the patient and medical records.    Past Medical History  Diagnosis Date  . Hyperlipidemia   . Hypertension   . Arthritis   . Mitral valve prolapse   . Glucose intolerance (impaired glucose tolerance)   . Hyperplastic colon polyp     History reviewed. No pertinent past surgical history.  Family History  Problem Relation Age of Onset  . Hypertension Mother   . Diabetes Father   . Hypertension Father   . Cancer Neg Hx     no breast or colon cancer  . Diabetes Brother   . Diabetes Brother   . Diabetes Sister     History  Substance Use Topics  . Smoking status: Never Smoker   . Smokeless tobacco: Never Used  . Alcohol Use: No    OB History   Grav Para Term Preterm Abortions TAB SAB Ect Mult Living                  Review of Systems  Constitutional: Positive for fatigue. Negative for fever.  HENT: Negative for sore throat and rhinorrhea.   Eyes:  Negative for redness.  Respiratory: Negative for cough and shortness of breath.   Cardiovascular: Negative for chest pain.  Gastrointestinal: Negative for nausea, vomiting, abdominal pain and diarrhea.  Genitourinary: Positive for frequency. Negative for dysuria and hematuria.  Musculoskeletal: Negative for myalgias.  Skin: Negative for rash.  Neurological: Positive for weakness (generalized). Negative for headaches.    Allergies  Lisinopril  Home Medications   Current Outpatient Rx  Name  Route  Sig  Dispense  Refill  . atenolol (TENORMIN) 100 MG tablet   Oral   Take 1 tablet (100 mg total) by mouth daily.   30 tablet   11   . atenolol (TENORMIN) 100 MG tablet      TAKE 1 TABLET (100 MG TOTAL) BY MOUTH DAILY.   30 tablet   10     CYCLE FILL MEDICATION. Authorization is required f ...   . triamterene-hydrochlorothiazide (MAXZIDE-25) 37.5-25 MG per tablet      TAKE 1 TABLET BY MOUTH DAILY.   30 tablet   11     CYCLE FILL MEDICATION. Authorization is required f ...     BP 166/87  Pulse 71  Temp(Src) 97.8 F (36.6 C) (Oral)  Resp 14  SpO2 98%  Physical Exam  Nursing note and vitals reviewed. Constitutional: She appears well-developed and well-nourished.  HENT:  Head: Normocephalic and atraumatic.  Eyes: Conjunctivae are normal. Right eye exhibits no discharge. Left  eye exhibits no discharge.  Neck: Normal range of motion. Neck supple.  Cardiovascular: Normal rate, regular rhythm and normal heart sounds.   Pulmonary/Chest: Effort normal and breath sounds normal.  Abdominal: Soft. There is no tenderness.  Neurological: She is alert.  Skin: Skin is warm and dry.  Psychiatric: She has a normal mood and affect.    ED Course  Procedures (including critical care time)  Labs Reviewed  URINALYSIS, ROUTINE W REFLEX MICROSCOPIC - Abnormal; Notable for the following:    Specific Gravity, Urine 1.034 (*)    Glucose, UA >1000 (*)    Ketones, ur 15 (*)    All  other components within normal limits  CBC WITH DIFFERENTIAL - Abnormal; Notable for the following:    MCHC 36.4 (*)    All other components within normal limits  COMPREHENSIVE METABOLIC PANEL - Abnormal; Notable for the following:    Sodium 128 (*)    Chloride 88 (*)    Glucose, Bld 593 (*)    Calcium 11.6 (*)    Alkaline Phosphatase 120 (*)    GFR calc non Af Amer 55 (*)    GFR calc Af Amer 64 (*)    All other components within normal limits  GLUCOSE, CAPILLARY - Abnormal; Notable for the following:    Glucose-Capillary 548 (*)    All other components within normal limits  URINE MICROSCOPIC-ADD ON - Abnormal; Notable for the following:    Bacteria, UA FEW (*)    All other components within normal limits  GLUCOSE, CAPILLARY - Abnormal; Notable for the following:    Glucose-Capillary 420 (*)    All other components within normal limits   No results found.   1. Diabetes mellitus   2. Hyperglycemia without ketosis     6:32 PM Patient seen and examined. Work-up initiated. Medications ordered.   Vital signs reviewed and are as follows: Filed Vitals:   08/10/12 1802  BP: 166/87  Pulse: 71  Temp: 97.8 F (36.6 C)  Resp: 14   Glucose stabilizer ordered.   8:33 PM Handoff to [facility] NP at shift change.  Plan: improve blood sugar, d/c to home on metformin if renal function allows, pt to f/u with Dr. Alphonsus Sias.     MDM  Hyperglycemia, new onset DM. No ketosis. Anticipate d/c when sugars controlled.         Renne Crigler, PA-C 08/10/12 2034

## 2012-08-10 NOTE — ED Notes (Signed)
The pt has had blurred vision with  Thirst no appetite and freguent urination for 7 days.  She has been borderline diabetic and was checked one year ago

## 2012-08-10 NOTE — ED Notes (Signed)
She has also had blurred vision weakness and no energy with weight loss

## 2012-08-11 LAB — GLUCOSE, CAPILLARY
Glucose-Capillary: 135 mg/dL — ABNORMAL HIGH (ref 70–99)
Glucose-Capillary: 163 mg/dL — ABNORMAL HIGH (ref 70–99)
Glucose-Capillary: 193 mg/dL — ABNORMAL HIGH (ref 70–99)

## 2012-08-11 MED ORDER — METFORMIN HCL ER 750 MG PO TB24: 750.0000 mg | ORAL_TABLET | Freq: Every day | ORAL | Status: DC

## 2012-08-11 NOTE — ED Provider Notes (Signed)
Medical screening examination/treatment/procedure(s) were performed by non-physician practitioner and as supervising physician I was immediately available for consultation/collaboration.   Sandra [name]. [name], MD 08/11/12 1057

## 2012-08-11 NOTE — ED Provider Notes (Signed)
  Physical Exam  BP 166/87  Pulse 71  Temp(Src) 97.8 F (36.6 C) (Oral)  Resp 14  SpO2 98%  Physical Exam This is a new onset diabetic, who is on glucometer.  We'll monitor sugars on 2 within normal range, then discharge him with prescription for metformin.  Patient does have a doctor, who she can followup with this week ED Course  Procedures  MDM At time of discharge.  Patient's blood sugar is less than 200.  She was discharged him with a prescription for metformin 750 mg once a day, with strict instructions to followup with Dr.Letvik on Monday.  She's also been given extensive documentation on diabetes diet.  Self-care etc.      Arman Filter, NP 08/11/12 4696

## 2012-08-11 NOTE — ED Provider Notes (Signed)
Medical screening examination/treatment/procedure(s) were performed by non-physician practitioner and as supervising physician I was immediately available for consultation/collaboration.   Leandrew Keech Y. Castiel Lauricella, MD 08/11/12 1057 

## 2012-08-12 ENCOUNTER — Ambulatory Visit: Payer: Self-pay | Admitting: Internal Medicine

## 2012-08-15 ENCOUNTER — Encounter (HOSPITAL_COMMUNITY): Payer: Self-pay | Admitting: *Deleted

## 2012-08-15 DIAGNOSIS — R110 Nausea: Secondary | ICD-10-CM | POA: Insufficient documentation

## 2012-08-15 DIAGNOSIS — Z8679 Personal history of other diseases of the circulatory system: Secondary | ICD-10-CM | POA: Insufficient documentation

## 2012-08-15 DIAGNOSIS — Z862 Personal history of diseases of the blood and blood-forming organs and certain disorders involving the immune mechanism: Secondary | ICD-10-CM | POA: Insufficient documentation

## 2012-08-15 DIAGNOSIS — Z8739 Personal history of other diseases of the musculoskeletal system and connective tissue: Secondary | ICD-10-CM | POA: Insufficient documentation

## 2012-08-15 DIAGNOSIS — E119 Type 2 diabetes mellitus without complications: Secondary | ICD-10-CM | POA: Insufficient documentation

## 2012-08-15 DIAGNOSIS — I10 Essential (primary) hypertension: Secondary | ICD-10-CM | POA: Insufficient documentation

## 2012-08-15 DIAGNOSIS — Z79899 Other long term (current) drug therapy: Secondary | ICD-10-CM | POA: Insufficient documentation

## 2012-08-15 DIAGNOSIS — Z860100 Personal history of colon polyps, unspecified: Secondary | ICD-10-CM | POA: Insufficient documentation

## 2012-08-15 DIAGNOSIS — Z8639 Personal history of other endocrine, nutritional and metabolic disease: Secondary | ICD-10-CM | POA: Insufficient documentation

## 2012-08-15 DIAGNOSIS — R5381 Other malaise: Secondary | ICD-10-CM | POA: Insufficient documentation

## 2012-08-15 DIAGNOSIS — R5383 Other fatigue: Secondary | ICD-10-CM | POA: Insufficient documentation

## 2012-08-15 DIAGNOSIS — E785 Hyperlipidemia, unspecified: Secondary | ICD-10-CM | POA: Insufficient documentation

## 2012-08-15 LAB — COMPREHENSIVE METABOLIC PANEL
ALT: 31 U/L (ref 0–35)
AST: 27 U/L (ref 0–37)
Albumin: 4.1 g/dL (ref 3.5–5.2)
Alkaline Phosphatase: 90 U/L (ref 39–117)
Chloride: 93 mEq/L — ABNORMAL LOW (ref 96–112)
Potassium: 4.2 mEq/L (ref 3.5–5.1)
Sodium: 131 mEq/L — ABNORMAL LOW (ref 135–145)
Total Bilirubin: 0.7 mg/dL (ref 0.3–1.2)
Total Protein: 7.4 g/dL (ref 6.0–8.3)

## 2012-08-15 LAB — CBC WITH DIFFERENTIAL/PLATELET
Basophils Absolute: 0 10*3/uL (ref 0.0–0.1)
Basophils Relative: 1 % (ref 0–1)
Hemoglobin: 12.1 g/dL (ref 12.0–15.0)
MCHC: 35.2 g/dL (ref 30.0–36.0)
Monocytes Relative: 8 % (ref 3–12)
Neutro Abs: 1.9 10*3/uL (ref 1.7–7.7)
Neutrophils Relative %: 42 % — ABNORMAL LOW (ref 43–77)
Platelets: 231 10*3/uL (ref 150–400)
RDW: 13.2 % (ref 11.5–15.5)

## 2012-08-15 NOTE — ED Notes (Signed)
Pt states that her blood sugar has been running high today. Pt was seen Saturday and was told for the first time that her blood sugar was in the 500's pt states she is now taking metformin and she took her dose this morning with breakfast and her CBG were still 460's.

## 2012-08-15 NOTE — ED Notes (Signed)
Wait time advised 

## 2012-08-16 ENCOUNTER — Emergency Department (HOSPITAL_COMMUNITY)
Admission: EM | Admit: 2012-08-16 | Discharge: 2012-08-16 | Disposition: A | Discharge status: Home / Self Care | Payer: Self-pay | Attending: Emergency Medicine | Admitting: Emergency Medicine

## 2012-08-16 ENCOUNTER — Telehealth: Payer: Self-pay | Admitting: *Deleted

## 2012-08-16 DIAGNOSIS — R739 Hyperglycemia, unspecified: Secondary | ICD-10-CM

## 2012-08-16 HISTORY — DX: Type 2 diabetes mellitus without complications: E11.9

## 2012-08-16 LAB — GLUCOSE, CAPILLARY
Glucose-Capillary: 310 mg/dL — ABNORMAL HIGH (ref 70–99)
Glucose-Capillary: 314 mg/dL — ABNORMAL HIGH (ref 70–99)
Glucose-Capillary: 350 mg/dL — ABNORMAL HIGH (ref 70–99)

## 2012-08-16 MED ORDER — SODIUM CHLORIDE 0.9 % IV BOLUS (SEPSIS)
1000.0000 mL | Freq: Once | INTRAVENOUS | Status: AC
  Administered 2012-08-16: 1000 mL via INTRAVENOUS

## 2012-08-16 MED ORDER — METFORMIN HCL 500 MG PO TABS: 500.0000 mg | ORAL_TABLET | Freq: Two times a day (BID) | ORAL | Status: DC

## 2012-08-16 MED ORDER — SODIUM CHLORIDE 0.9 % IV BOLUS (SEPSIS): 1000.0000 mL | Freq: Once | INTRAVENOUS | Status: DC

## 2012-08-16 MED ORDER — INSULIN ASPART 100 UNIT/ML ~~LOC~~ SOLN
5.0000 [IU] | Freq: Once | SUBCUTANEOUS | Status: AC
  Administered 2012-08-16: 5 [IU] via INTRAVENOUS
  Filled 2012-08-16: qty 1

## 2012-08-16 NOTE — ED Notes (Signed)
Patient recently dx DM. Metformin started 3 days ago. Reports frequent urination and thirst. No abdominal pain, n/v or GI discomfort. Has checked her CBG throughout the day and has had readings in the 400s. Has appt with PCP on 6/13.

## 2012-08-16 NOTE — ED Provider Notes (Signed)
History     CSN: [id number]  Arrival date & time 08/15/12  2004   First MD Initiated Contact with Patient 08/16/12 0031      Chief Complaint  Patient presents with  . Hyperglycemia    (Consider location/radiation/quality/duration/timing/severity/associated sxs/prior treatment) The history is provided by the patient.  Sandra [name] is a 62 y.o. female hx of HL, HTN, recent diagnosis of diabetes on metformin here with hyperglycemia. She was started on metformin 750 mg daily. It's been checking her sugar more often and noticed that her sugar has been running in the 400 to 500 range. She feels nauseous occasionally but did not vomit and denies any abdominal pain or fevers. She also has diffuse weakness. Came here for evaluation.  Past Medical History  Diagnosis Date  . Hyperlipidemia   . Hypertension   . Arthritis   . Mitral valve prolapse   . Glucose intolerance (impaired glucose tolerance)   . Hyperplastic colon polyp   . Diabetes mellitus without complication     History reviewed. No pertinent past surgical history.  Family History  Problem Relation Age of Onset  . Hypertension Mother   . Diabetes Father   . Hypertension Father   . Cancer Neg Hx     no breast or colon cancer  . Diabetes Brother   . Diabetes Brother   . Diabetes Sister     History  Substance Use Topics  . Smoking status: Never Smoker   . Smokeless tobacco: Never Used  . Alcohol Use: No    OB History   Grav Para Term Preterm Abortions TAB SAB Ect Mult Living                  Review of Systems  Gastrointestinal: Positive for nausea.  Neurological: Positive for weakness.  All other systems reviewed and are negative.    Allergies  Lisinopril  Home Medications   Current Outpatient Rx  Name  Route  Sig  Dispense  Refill  . atenolol (TENORMIN) 100 MG tablet   Oral   Take 100 mg by mouth daily.         . metFORMIN (GLUCOPHAGE-XR) 750 MG 24 hr tablet   Oral   Take 1 tablet (750 mg  total) by mouth daily with breakfast.   30 tablet   0   . triamterene-hydrochlorothiazide (MAXZIDE-25) 37.5-25 MG per tablet   Oral   Take 1 tablet by mouth daily.           BP 143/70  Pulse 50  Temp(Src) 98.2 F (36.8 C) (Oral)  Resp 16  Ht 5\' 3"  (1.6 m)  Wt 170 lb (77.111 kg)  BMI 30.12 kg/m2  SpO2 98%  Physical Exam  Nursing note and vitals reviewed. Constitutional: She is oriented to person, place, and time. She appears well-developed and well-nourished.  HENT:  Head: Normocephalic.  MM slightly dry   Eyes: Conjunctivae are normal. Pupils are equal, round, and reactive to light.  Neck: Normal range of motion. Neck supple.  Cardiovascular: Normal rate, regular rhythm and normal heart sounds.   Pulmonary/Chest: Effort normal and breath sounds normal. No respiratory distress. She has no wheezes. She has no rales.  Abdominal: Soft. Bowel sounds are normal. She exhibits no distension. There is no tenderness. There is no rebound.  Musculoskeletal: Normal range of motion.  Neurological: She is alert and oriented to person, place, and time. No cranial nerve deficit. Coordination normal.  Nl strength and sensation throughout   Skin:  Skin is warm and dry.  Psychiatric: She has a normal mood and affect. Her behavior is normal. Judgment and thought content normal.    ED Course  Procedures (including critical care time)  Labs Reviewed  CBC WITH DIFFERENTIAL - Abnormal; Notable for the following:    HCT 34.4 (*)    Neutrophils Relative % 42 (*)    All other components within normal limits  COMPREHENSIVE METABOLIC PANEL - Abnormal; Notable for the following:    Sodium 131 (*)    Chloride 93 (*)    Glucose, Bld 437 (*)    Calcium 11.2 (*)    GFR calc non Af Amer 57 (*)    GFR calc Af Amer 67 (*)    All other components within normal limits  GLUCOSE, CAPILLARY - Abnormal; Notable for the following:    Glucose-Capillary 440 (*)    All other components within normal limits   GLUCOSE, CAPILLARY - Abnormal; Notable for the following:    Glucose-Capillary 350 (*)    All other components within normal limits  GLUCOSE, CAPILLARY - Abnormal; Notable for the following:    Glucose-Capillary 314 (*)    All other components within normal limits  GLUCOSE, CAPILLARY - Abnormal; Notable for the following:    Glucose-Capillary 310 (*)    All other components within normal limits  GLUCOSE, CAPILLARY - Abnormal; Notable for the following:    Glucose-Capillary 274 (*)    All other components within normal limits   No results found.   No diagnosis found.    MDM  Sandra [name] is a 62 y.o. female here with hyperglycemia. No AG on CMP. CBG was 440 on arrival. Will give IVf and reassess.  4:13 AM CBG decreased to 274 with IVF and some insulin. Will increase metformin to 500 mg BID. Will have her f/u with PMD to get diabetes under control. Recommend low carb diet.         Richardean Canal, MD 08/16/12 [phone]

## 2012-08-16 NOTE — Telephone Encounter (Signed)
Call her today please  Please have her start glipizide 5mg  daily ($4 per 30 tabs) with AM metformin #30 x 3  Please give her an appt for next week  Note to not take money at the desk---we will decide the charge after the visit (let her know I can probably make it just $60)

## 2012-08-16 NOTE — Telephone Encounter (Signed)
.  left message to have patient return my call.  

## 2012-08-16 NOTE — ED Notes (Signed)
Discharge instructions and information reviewed and given to patient. Home CBG monitoring, medications and diet r/t diabetes discussed. Patient verbalized understanding.

## 2012-08-20 NOTE — Telephone Encounter (Signed)
.  left message to have patient return my call.  

## 2012-08-22 NOTE — Telephone Encounter (Signed)
She still has no insurance Try to make sure she at least can go somewhere---reduced charge clinic like [facility]?

## 2012-08-22 NOTE — Telephone Encounter (Signed)
Patient still not calling back, pt canceled her appt for tomorrow 08/23/2012, .left message to have patient return my call.

## 2012-08-22 NOTE — Telephone Encounter (Signed)
Per Lyla Son pt is going to her GYN

## 2012-08-23 ENCOUNTER — Encounter: Payer: Self-pay | Admitting: Obstetrics & Gynecology

## 2012-08-23 ENCOUNTER — Ambulatory Visit: Payer: Self-pay | Admitting: Internal Medicine

## 2012-08-27 MED ORDER — GLIPIZIDE 5 MG PO TABS: 5.0000 mg | ORAL_TABLET | Freq: Every day | ORAL | Status: DC

## 2012-08-27 MED ORDER — METFORMIN HCL ER 750 MG PO TB24: 750.0000 mg | ORAL_TABLET | Freq: Every day | ORAL | Status: DC

## 2012-08-27 NOTE — Telephone Encounter (Signed)
Spoke with patient and she's trying to get insurance and has tried going to the clinic in [place] based on your income, she's also going to try the clinic in [place]. rx sent to pharmacy by e-script

## 2012-08-27 NOTE — Addendum Note (Signed)
Addended by: Sueanne Margarita on: 08/27/2012 10:09 AM   Modules accepted: Orders

## 2012-10-21 ENCOUNTER — Other Ambulatory Visit: Payer: Self-pay | Admitting: Internal Medicine

## 2012-10-23 ENCOUNTER — Ambulatory Visit (INDEPENDENT_AMBULATORY_CARE_PROVIDER_SITE_OTHER): Payer: Self-pay | Admitting: Internal Medicine

## 2012-10-23 ENCOUNTER — Encounter: Payer: Self-pay | Admitting: Internal Medicine

## 2012-10-23 VITALS — BP 140/80 | HR 60 | Temp 98.6°F | Wt 171.0 lb

## 2012-10-23 DIAGNOSIS — I10 Essential (primary) hypertension: Secondary | ICD-10-CM

## 2012-10-23 DIAGNOSIS — E210 Primary hyperparathyroidism: Secondary | ICD-10-CM

## 2012-10-23 DIAGNOSIS — E1169 Type 2 diabetes mellitus with other specified complication: Secondary | ICD-10-CM | POA: Insufficient documentation

## 2012-10-23 DIAGNOSIS — E119 Type 2 diabetes mellitus without complications: Secondary | ICD-10-CM

## 2012-10-23 DIAGNOSIS — E785 Hyperlipidemia, unspecified: Secondary | ICD-10-CM

## 2012-10-23 LAB — HEPATIC FUNCTION PANEL
ALT: 21 U/L (ref 0–35)
AST: 20 U/L (ref 0–37)
Albumin: 3.9 g/dL (ref 3.5–5.2)
Alkaline Phosphatase: 58 U/L (ref 39–117)
Bilirubin, Direct: 0 mg/dL (ref 0.0–0.3)
Total Protein: 7.4 g/dL (ref 6.0–8.3)

## 2012-10-23 LAB — CBC WITH DIFFERENTIAL/PLATELET
Basophils Absolute: 0 10*3/uL (ref 0.0–0.1)
Basophils Relative: 0.4 % (ref 0.0–3.0)
HCT: 33.8 % — ABNORMAL LOW (ref 36.0–46.0)
Hemoglobin: 11.3 g/dL — ABNORMAL LOW (ref 12.0–15.0)
Lymphocytes Relative: 27.2 % (ref 12.0–46.0)
Lymphs Abs: 1.3 10*3/uL (ref 0.7–4.0)
Monocytes Relative: 8.9 % (ref 3.0–12.0)
Neutro Abs: 2.9 10*3/uL (ref 1.4–7.7)
RBC: 3.73 Mil/uL — ABNORMAL LOW (ref 3.87–5.11)
RDW: 14.5 % (ref 11.5–14.6)

## 2012-10-23 LAB — BASIC METABOLIC PANEL
CO2: 29 mEq/L (ref 19–32)
Calcium: 10.6 mg/dL — ABNORMAL HIGH (ref 8.4–10.5)
GFR: 87.32 mL/min (ref >60.00)
Glucose, Bld: 109 mg/dL — ABNORMAL HIGH (ref 70–99)
Potassium: 3.7 mEq/L (ref 3.5–5.1)
Sodium: 138 mEq/L (ref 135–145)

## 2012-10-23 LAB — HEMOGLOBIN A1C: Hgb A1c MFr Bld: 7.7 % — ABNORMAL HIGH (ref 4.6–6.5)

## 2012-10-23 LAB — LIPID PANEL
Total CHOL/HDL Ratio: 4
VLDL: 34.2 mg/dL (ref 0.0–40.0)

## 2012-10-23 LAB — HM DIABETES FOOT EXAM: HM Diabetic Foot Exam: NORMAL

## 2012-10-23 MED ORDER — METFORMIN HCL 500 MG PO TABS: 500.0000 mg | ORAL_TABLET | Freq: Two times a day (BID) | ORAL | Status: DC

## 2012-10-23 MED ORDER — PRAVASTATIN SODIUM 40 MG PO TABS: 40.0000 mg | ORAL_TABLET | Freq: Every day | ORAL | Status: DC

## 2012-10-23 NOTE — Assessment & Plan Note (Signed)
Has had some cramping---but nothing to suggest symptoms from this Will recheck labs

## 2012-10-23 NOTE — Assessment & Plan Note (Signed)
BP Readings from Last 3 Encounters:  10/23/12 140/80  08/16/12 143/70  08/11/12 117/80   Good control No changes needed

## 2012-10-23 NOTE — Assessment & Plan Note (Signed)
Will start pravastatin 40mg  due to diabetes

## 2012-10-23 NOTE — Patient Instructions (Signed)
Please set up blood work in about 6 weeks (lipid, hepatic--272.4) 

## 2012-10-23 NOTE — Progress Notes (Signed)
  Subjective:    Patient ID: Sandra [name], female    DOB: 15-Sep-1950, 62 y.o.   MRN: [id number]  HPI Here for follow up of diabetes Reviewed ER visit with hyperglycemia Tolerating the medications Checks sugars every other morning--often under 100 Gets some mild low sugar reactions before lunch  Gets right calf cramping Can start up near hip Especially bad if bearing down to move bowels or turning over in bed  No chest pain No SOB  Current Outpatient Prescriptions on File Prior to Visit  Medication Sig Dispense Refill  . atenolol (TENORMIN) 100 MG tablet Take 100 mg by mouth daily.      Marland Kitchen glipiZIDE (GLUCOTROL) 5 MG tablet Take 1 tablet (5 mg total) by mouth daily.  30 tablet  3  . metFORMIN (GLUCOPHAGE-XR) 750 MG 24 hr tablet Take 1 tablet (750 mg total) by mouth daily with breakfast.  30 tablet  3  . triamterene-hydrochlorothiazide (MAXZIDE-25) 37.5-25 MG per tablet Take 1 tablet by mouth daily.       No current facility-administered medications on file prior to visit.    Allergies  Allergen Reactions  . Lisinopril     REACTION: Angioedema    Past Medical History  Diagnosis Date  . Hyperlipidemia   . Hypertension   . Arthritis   . Mitral valve prolapse   . Hyperplastic colon polyp   . Diabetes mellitus without complication     No past surgical history on file.  Family History  Problem Relation Age of Onset  . Hypertension Mother   . Diabetes Father   . Hypertension Father   . Cancer Neg Hx     no breast or colon cancer  . Diabetes Brother   . Diabetes Brother   . Diabetes Sister     History   Social History  . Marital Status: Married    Spouse Name: N/A    Number of Children: 3  . Years of Education: N/A   Occupational History  . clean houses    Social History Main Topics  . Smoking status: Never Smoker   . Smokeless tobacco: Never Used  . Alcohol Use: No  . Drug Use: Not on file  . Sexual Activity: Not on file   Other Topics Concern  . Not  on file   Social History Narrative  . No narrative on file   Review of Systems Walking regularly Weight down over 10# in the past few months Sleeps okay    Objective:   Physical Exam  Constitutional: She appears well-developed and well-nourished. No distress.  Neck: Normal range of motion. Neck supple. No thyromegaly present.  Cardiovascular: Normal rate, regular rhythm and intact distal pulses.  Exam reveals no gallop.   Murmur heard. Soft systolic murmur  Pulmonary/Chest: Effort normal and breath sounds normal. No respiratory distress. She has no wheezes. She has no rales.  Musculoskeletal: She exhibits no edema and no tenderness.  Mild pain with right lateral meniscus strain No calf swelling or tenderness  Lymphadenopathy:    She has no cervical adenopathy.  Psychiatric: She has a normal mood and affect. Her behavior is normal.          Assessment & Plan:

## 2012-10-23 NOTE — Assessment & Plan Note (Signed)
Seems to have good control Will change to regular metformin Check labs

## 2012-10-25 ENCOUNTER — Other Ambulatory Visit: Payer: Self-pay | Admitting: Internal Medicine

## 2012-10-28 ENCOUNTER — Encounter: Payer: Self-pay | Admitting: *Deleted

## 2012-11-22 ENCOUNTER — Other Ambulatory Visit: Payer: Self-pay | Admitting: Internal Medicine

## 2012-12-04 ENCOUNTER — Other Ambulatory Visit (INDEPENDENT_AMBULATORY_CARE_PROVIDER_SITE_OTHER): Payer: Self-pay

## 2012-12-04 ENCOUNTER — Encounter: Payer: Self-pay | Admitting: *Deleted

## 2012-12-04 DIAGNOSIS — E785 Hyperlipidemia, unspecified: Secondary | ICD-10-CM

## 2012-12-04 DIAGNOSIS — I10 Essential (primary) hypertension: Secondary | ICD-10-CM

## 2012-12-04 LAB — LIPID PANEL
Cholesterol: 168 mg/dL (ref 0–200)
HDL: 51.5 mg/dL (ref >39.00)
LDL Cholesterol: 103 mg/dL — ABNORMAL HIGH (ref 0–99)
Total CHOL/HDL Ratio: 3
Triglycerides: 69 mg/dL (ref 0.0–149.0)
VLDL: 13.8 mg/dL (ref 0.0–40.0)

## 2012-12-04 LAB — HEPATIC FUNCTION PANEL
ALT: 18 U/L (ref 0–35)
AST: 20 U/L (ref 0–37)
Albumin: 3.8 g/dL (ref 3.5–5.2)
Alkaline Phosphatase: 51 U/L (ref 39–117)
Bilirubin, Direct: 0 mg/dL (ref 0.0–0.3)
Total Bilirubin: 0.7 mg/dL (ref 0.3–1.2)
Total Protein: 7 g/dL (ref 6.0–8.3)

## 2013-01-29 ENCOUNTER — Telehealth: Payer: Self-pay

## 2013-01-29 NOTE — Telephone Encounter (Signed)
Pt has dental appt for a filling in tooth 01/30/13 and wants to know if needs to pre medicate with antibiotics. Pt has been premedicated in the past due to pt taking heart medication.Please advise.

## 2013-01-29 NOTE — Telephone Encounter (Signed)
She was premedicating in the past due to mitral valve prolapse That is no longer recommended so she doesn't need antibiotics before the dentist anymore

## 2013-02-21 ENCOUNTER — Other Ambulatory Visit: Payer: Self-pay | Admitting: Internal Medicine

## 2013-03-22 ENCOUNTER — Other Ambulatory Visit: Payer: Self-pay | Admitting: Internal Medicine

## 2013-04-22 ENCOUNTER — Other Ambulatory Visit: Payer: Self-pay | Admitting: Internal Medicine

## 2013-04-25 ENCOUNTER — Ambulatory Visit (INDEPENDENT_AMBULATORY_CARE_PROVIDER_SITE_OTHER): Payer: No Typology Code available for payment source | Admitting: Internal Medicine

## 2013-04-25 ENCOUNTER — Encounter: Payer: Self-pay | Admitting: Internal Medicine

## 2013-04-25 VITALS — BP 122/70 | HR 58 | Temp 98.2°F | Wt 175.0 lb

## 2013-04-25 DIAGNOSIS — R209 Unspecified disturbances of skin sensation: Secondary | ICD-10-CM

## 2013-04-25 DIAGNOSIS — R200 Anesthesia of skin: Secondary | ICD-10-CM

## 2013-04-25 DIAGNOSIS — E119 Type 2 diabetes mellitus without complications: Secondary | ICD-10-CM

## 2013-04-25 DIAGNOSIS — I10 Essential (primary) hypertension: Secondary | ICD-10-CM

## 2013-04-25 DIAGNOSIS — E785 Hyperlipidemia, unspecified: Secondary | ICD-10-CM

## 2013-04-25 LAB — HEMOGLOBIN A1C: Hgb A1c MFr Bld: 6.3 % (ref 4.6–6.5)

## 2013-04-25 MED ORDER — SIMVASTATIN 40 MG PO TABS: 40.0000 mg | ORAL_TABLET | Freq: Every day | ORAL | Status: DC

## 2013-04-25 NOTE — Progress Notes (Signed)
   Subjective:    Patient ID: Sandra [name], female    DOB: [birth year], 63 y.o.   MRN: [id number]  HPI Doing okay  Concerned about the pravastatin Is raising her sugars Is too expensive--off the [employer] list  Checks sugars weekly Usually in the 90's Has had occasional low sugar reactions--may get ~11-12 before lunch  Having problems with her hands Numbness--especially at night No pain Better after shaking them out some No foot numbness  No chest pain No SOB No dizziness or syncope  Current Outpatient Prescriptions on File Prior to Visit  Medication Sig Dispense Refill  . atenolol (TENORMIN) 100 MG tablet TAKE 1 TABLET (100 MG TOTAL) BY MOUTH DAILY.  30 tablet  11  . metFORMIN (GLUCOPHAGE) 500 MG tablet Take 1 tablet (500 mg total) by mouth 2 (two) times daily with a meal.  60 tablet  11  . triamterene-hydrochlorothiazide (MAXZIDE-25) 37.5-25 MG per tablet TAKE 1 TABLET BY MOUTH DAILY.  30 tablet  12   No current facility-administered medications on file prior to visit.    Allergies  Allergen Reactions  . Lisinopril     REACTION: Angioedema    Past Medical History  Diagnosis Date  . Hyperlipidemia   . Hypertension   . Arthritis   . Mitral valve prolapse   . Hyperplastic colon polyp   . Diabetes mellitus without complication     No past surgical history on file.  Family History  Problem Relation Age of Onset  . Hypertension Mother   . Diabetes Father   . Hypertension Father   . Cancer Neg Hx     no breast or colon cancer  . Diabetes Brother   . Diabetes Brother   . Diabetes Sister     History   Social History  . Marital Status: Married    Spouse Name: N/A    Number of Children: 3  . Years of Education: N/A   Occupational History  . clean houses    Social History Main Topics  . Smoking status: Never Smoker   . Smokeless tobacco: Never Used  . Alcohol Use: No  . Drug Use: Not on file  . Sexual Activity: Not on file   Other Topics  Concern  . Not on file   Social History Narrative  . No narrative on file   Review of Systems Appetite is okay Weight is up a few pounds     Objective:   Physical Exam  Constitutional: She appears well-developed and well-nourished. No distress.  Neck: Normal range of motion. Neck supple. No thyromegaly present.  Cardiovascular: Normal rate, regular rhythm, normal heart sounds and intact distal pulses.  Exam reveals no gallop.   No murmur heard. Pulmonary/Chest: Effort normal and breath sounds normal. No respiratory distress. She has no wheezes. She has no rales.  Musculoskeletal: She exhibits no edema and no tenderness.  Lymphadenopathy:    She has no cervical adenopathy.  Neurological:  Normal strength in hands No loss of muscle  Skin: No rash noted.  No foot lesions  Psychiatric: She has a normal mood and affect. Her behavior is normal.          Assessment & Plan:

## 2013-04-25 NOTE — Assessment & Plan Note (Signed)
Hopefully still reasonable control Info given Check A1c

## 2013-04-25 NOTE — Progress Notes (Signed)
Pre-visit discussion using our clinic review tool. No additional management support is needed unless otherwise documented below in the visit note.  

## 2013-04-25 NOTE — Patient Instructions (Addendum)
Please set up your diabetic eye exam. Please try wearing wrist braces at night  Diabetes and Exercise Exercising regularly is important. It is not just about losing weight. It has many health benefits, such as:  Improving your overall fitness, flexibility, and endurance.  Increasing your bone density.  Helping with weight control.  Decreasing your body fat.  Increasing your muscle strength.  Reducing stress and tension.  Improving your overall health. People with diabetes who exercise gain additional benefits because exercise:  Reduces appetite.  Improves the body's use of blood sugar (glucose).  Helps lower or control blood glucose.  Decreases blood pressure.  Helps control blood lipids (such as cholesterol and triglycerides).  Improves the body's use of the hormone insulin by:  Increasing the body's insulin sensitivity.  Reducing the body's insulin needs.  Decreases the risk for heart disease because exercising:  Lowers cholesterol and triglycerides levels.  Increases the levels of good cholesterol (such as high-density lipoproteins [HDL]) in the body.  Lowers blood glucose levels. YOUR ACTIVITY PLAN  Choose an activity that you enjoy and set realistic goals. Your health care provider or diabetes educator can help you make an activity plan that works for you. You can break activities into 2 or 3 sessions throughout the day. Doing so is as good as one long session. Exercise ideas include:  Taking the dog for a walk.  Taking the stairs instead of the elevator.  Dancing to your favorite song.  Doing your favorite exercise with a friend. RECOMMENDATIONS FOR EXERCISING WITH TYPE 1 OR TYPE 2 DIABETES   Check your blood glucose before exercising. If blood glucose levels are greater than 240 mg/dL, check for urine ketones. Do not exercise if ketones are present.  Avoid injecting insulin into areas of the body that are going to be exercised. For example, avoid  injecting insulin into:  The arms when playing tennis.  The legs when jogging.  Keep a record of:  Food intake before and after you exercise.  Expected peak times of insulin action.  Blood glucose levels before and after you exercise.  The type and amount of exercise you have done.  Review your records with your health care provider. Your health care provider will help you to develop guidelines for adjusting food intake and insulin amounts before and after exercising.  If you take insulin or oral hypoglycemic agents, watch for signs and symptoms of hypoglycemia. They include:  Dizziness.  Shaking.  Sweating.  Chills.  Confusion.  Drink plenty of water while you exercise to prevent dehydration or heat stroke. Body water is lost during exercise and must be replaced.  Talk to your health care provider before starting an exercise program to make sure it is safe for you. Remember, almost any type of activity is better than none. Document Released: 05/20/2003 Document Revised: 10/30/2012 Document Reviewed: 08/06/2012 [facility] Patient Information 2014 [place]. Diabetes Meal Planning Guide The diabetes meal planning guide is a tool to help you plan your meals and snacks. It is important for people with diabetes to manage their blood glucose (sugar) levels. Choosing the right foods and the right amounts throughout your day will help control your blood glucose. Eating right can even help you improve your blood pressure and reach or maintain a healthy weight. CARBOHYDRATE COUNTING MADE EASY When you eat carbohydrates, they turn to sugar. This raises your blood glucose level. Counting carbohydrates can help you control this level so you feel better. When you plan your meals by  counting carbohydrates, you can have more flexibility in what you eat and balance your medicine with your food intake. Carbohydrate counting simply means adding up the total amount of carbohydrate grams in  your meals and snacks. Try to eat about the same amount at each meal. Foods with carbohydrates are listed below. Each portion below is 1 carbohydrate serving or 15 grams of carbohydrates. Ask your dietician how many grams of carbohydrates you should eat at each meal or snack. Grains and Starches  1 slice bread.   English muffin or hotdog/hamburger bun.   cup cold cereal (unsweetened).   cup cooked pasta or rice.   cup starchy vegetables (corn, potatoes, peas, beans, winter squash).  1 tortilla (6 inches).   bagel.  1 waffle or pancake (size of a CD).   cup cooked cereal.  4 to 6 small crackers. *Whole grain is recommended. Fruit  1 cup fresh unsweetened berries, melon, papaya, pineapple.  1 small fresh fruit.   banana or mango.   cup fruit juice (4 oz unsweetened).   cup canned fruit in natural juice or water.  2 tbs dried fruit.  12 to 15 grapes or cherries. Milk and Yogurt  1 cup fat-free or 1% milk.  1 cup soy milk.  6 oz light yogurt with sugar-free sweetener.  6 oz low-fat soy yogurt.  6 oz plain yogurt. Vegetables  1 cup raw or  cup cooked is counted as 0 carbohydrates or a "free" food.  If you eat 3 or more servings at 1 meal, count them as 1 carbohydrate serving. Other Carbohydrates   oz chips or pretzels.   cup ice cream or frozen yogurt.   cup sherbet or sorbet.  2 inch square cake, no frosting.  1 tbs honey, sugar, jam, jelly, or syrup.  2 small cookies.  3 squares of graham crackers.  3 cups popcorn.  6 crackers.  1 cup broth-based soup.  Count 1 cup casserole or other mixed foods as 2 carbohydrate servings.  Foods with less than 20 calories in a serving may be counted as 0 carbohydrates or a "free" food. You may want to purchase a book or computer software that lists the carbohydrate gram counts of different foods. In addition, the nutrition facts panel on the labels of the foods you eat are a good source of this  information. The label will tell you how big the serving size is and the total number of carbohydrate grams you will be eating per serving. Divide this number by 15 to obtain the number of carbohydrate servings in a portion. Remember, 1 carbohydrate serving equals 15 grams of carbohydrate. SERVING SIZES Measuring foods and serving sizes helps you make sure you are getting the right amount of food. The list below tells how big or small some common serving sizes are.  1 oz.........4 stacked dice.  3 oz........Marland KitchenDeck of cards.  1 tsp.......Marland KitchenTip of little finger.  1 tbs......Marland KitchenMarland KitchenThumb.  2 tbs.......Marland KitchenGolf ball.   cup......Marland KitchenHalf of a fist.  1 cup.......Marland KitchenA fist. SAMPLE DIABETES MEAL PLAN Below is a sample meal plan that includes foods from the grain and starches, dairy, vegetable, fruit, and meat groups. A dietician can individualize a meal plan to fit your calorie needs and tell you the number of servings needed from each food group. However, controlling the total amount of carbohydrates in your meal or snack is more important than making sure you include all of the food groups at every meal. You may interchange carbohydrate containing foods (dairy,  starches, and fruits). The meal plan below is an example of a 2000 calorie diet using carbohydrate counting. This meal plan has 17 carbohydrate servings. Breakfast  1 cup oatmeal (2 carb servings).   cup light yogurt (1 carb serving).  1 cup blueberries (1 carb serving).   cup almonds. Snack  1 large apple (2 carb servings).  1 low-fat string cheese stick. Lunch  Chicken breast salad.  1 cup spinach.   cup chopped tomatoes.  2 oz chicken breast, sliced.  2 tbs low-fat [place] dressing.  12 whole-wheat crackers (2 carb servings).  12 to 15 grapes (1 carb serving).  1 cup low-fat milk (1 carb serving). Snack  1 cup carrots.   cup hummus (1 carb serving). Dinner  3 oz broiled salmon.  1 cup brown rice (3 carb  servings). Snack  1  cups steamed broccoli (1 carb serving) drizzled with 1 tsp olive oil and lemon juice.  1 cup light pudding (2 carb servings). DIABETES MEAL PLANNING WORKSHEET Your dietician can use this worksheet to help you decide how many servings of foods and what types of foods are right for you.  BREAKFAST Food Group and Servings / Carb Servings Grain/Starches __________________________________ Dairy __________________________________________ Vegetable ______________________________________ Fruit ___________________________________________ Meat __________________________________________ Fat ____________________________________________ LUNCH Food Group and Servings / Carb Servings Grain/Starches ___________________________________ Dairy ___________________________________________ Fruit ____________________________________________ Meat ___________________________________________ Fat _____________________________________________ W[employer] Food Group and Servings / Carb Servings Grain/Starches ___________________________________ Dairy ___________________________________________ Fruit ____________________________________________ Meat ___________________________________________ Fat _____________________________________________ SNACKS Food Group and Servings / Carb Servings Grain/Starches ___________________________________ Dairy ___________________________________________ Vegetable _______________________________________ Fruit ____________________________________________ Meat ___________________________________________ Fat _____________________________________________ DAILY TOTALS Starches _________________________ Vegetable ________________________ Fruit ____________________________ Dairy ____________________________ Meat ____________________________ Fat ______________________________ Document Released: 11/24/2004 Document Revised: 05/22/2011 Document Reviewed:  10/05/2008 [employer] Patient Information 2014 [employer].

## 2013-04-25 NOTE — Assessment & Plan Note (Signed)
Price of pravastatin went up Will change to simvastatin

## 2013-04-25 NOTE — Assessment & Plan Note (Signed)
May be early CTS Discussed braces at night

## 2013-04-25 NOTE — Assessment & Plan Note (Signed)
BP Readings from Last 3 Encounters:  04/25/13 122/70  10/23/12 140/80  08/16/12 143/70   Good control

## 2013-04-28 ENCOUNTER — Telehealth: Payer: Self-pay | Admitting: Internal Medicine

## 2013-04-28 ENCOUNTER — Encounter: Payer: Self-pay | Admitting: *Deleted

## 2013-04-28 NOTE — Telephone Encounter (Signed)
Relevant patient education mailed to patient.  

## 2013-06-12 ENCOUNTER — Emergency Department (HOSPITAL_COMMUNITY)
Admission: EM | Admit: 2013-06-12 | Discharge: 2013-06-13 | Disposition: A | Discharge status: Home / Self Care | Payer: No Typology Code available for payment source | Attending: Emergency Medicine | Admitting: Emergency Medicine

## 2013-06-12 ENCOUNTER — Telehealth: Payer: Self-pay

## 2013-06-12 ENCOUNTER — Telehealth: Payer: Self-pay | Admitting: Internal Medicine

## 2013-06-12 ENCOUNTER — Encounter (HOSPITAL_COMMUNITY): Payer: Self-pay | Admitting: Emergency Medicine

## 2013-06-12 DIAGNOSIS — N23 Unspecified renal colic: Secondary | ICD-10-CM

## 2013-06-12 DIAGNOSIS — I10 Essential (primary) hypertension: Secondary | ICD-10-CM | POA: Insufficient documentation

## 2013-06-12 DIAGNOSIS — E785 Hyperlipidemia, unspecified: Secondary | ICD-10-CM | POA: Insufficient documentation

## 2013-06-12 DIAGNOSIS — Z860100 Personal history of colon polyps, unspecified: Secondary | ICD-10-CM | POA: Insufficient documentation

## 2013-06-12 DIAGNOSIS — Z8739 Personal history of other diseases of the musculoskeletal system and connective tissue: Secondary | ICD-10-CM | POA: Insufficient documentation

## 2013-06-12 DIAGNOSIS — N1330 Unspecified hydronephrosis: Secondary | ICD-10-CM | POA: Insufficient documentation

## 2013-06-12 DIAGNOSIS — Z79899 Other long term (current) drug therapy: Secondary | ICD-10-CM | POA: Insufficient documentation

## 2013-06-12 DIAGNOSIS — E119 Type 2 diabetes mellitus without complications: Secondary | ICD-10-CM | POA: Insufficient documentation

## 2013-06-12 LAB — URINALYSIS, ROUTINE W REFLEX MICROSCOPIC
BILIRUBIN URINE: NEGATIVE
GLUCOSE, UA: NEGATIVE mg/dL
HGB URINE DIPSTICK: NEGATIVE
Ketones, ur: NEGATIVE mg/dL
Leukocytes, UA: NEGATIVE
Nitrite: NEGATIVE
PROTEIN: NEGATIVE mg/dL
Specific Gravity, Urine: 1.027 (ref 1.005–1.030)
Urobilinogen, UA: 0.2 mg/dL (ref 0.0–1.0)
pH: 6 (ref 5.0–8.0)

## 2013-06-12 LAB — CBC WITH DIFFERENTIAL/PLATELET
Basophils Absolute: 0 10*3/uL (ref 0.0–0.1)
Basophils Relative: 0 % (ref 0–1)
Eosinophils Absolute: 0 10*3/uL (ref 0.0–0.7)
Eosinophils Relative: 0 % (ref 0–5)
HEMATOCRIT: 34 % — AB (ref 36.0–46.0)
HEMOGLOBIN: 11.3 g/dL — AB (ref 12.0–15.0)
LYMPHS PCT: 12 % (ref 12–46)
Lymphs Abs: 0.9 10*3/uL (ref 0.7–4.0)
MCH: 29.9 pg (ref 26.0–34.0)
MCHC: 33.2 g/dL (ref 30.0–36.0)
MCV: 89.9 fL (ref 78.0–100.0)
MONO ABS: 0.2 10*3/uL (ref 0.1–1.0)
MONOS PCT: 3 % (ref 3–12)
NEUTROS ABS: 6.6 10*3/uL (ref 1.7–7.7)
Neutrophils Relative %: 85 % — ABNORMAL HIGH (ref 43–77)
Platelets: 246 10*3/uL (ref 150–400)
RBC: 3.78 MIL/uL — ABNORMAL LOW (ref 3.87–5.11)
RDW: 13.7 % (ref 11.5–15.5)
WBC: 7.7 10*3/uL (ref 4.0–10.5)

## 2013-06-12 LAB — COMPREHENSIVE METABOLIC PANEL
ALBUMIN: 3.9 g/dL (ref 3.5–5.2)
ALT: 24 U/L (ref 0–35)
AST: 21 U/L (ref 0–37)
Alkaline Phosphatase: 66 U/L (ref 39–117)
BUN: 22 mg/dL (ref 6–23)
CALCIUM: 11.1 mg/dL — AB (ref 8.4–10.5)
CO2: 26 mEq/L (ref 19–32)
Chloride: 100 mEq/L (ref 96–112)
Creatinine, Ser: 0.89 mg/dL (ref 0.50–1.10)
GFR calc non Af Amer: 68 mL/min — ABNORMAL LOW (ref >90)
GFR, EST AFRICAN AMERICAN: 79 mL/min — AB (ref >90)
GLUCOSE: 153 mg/dL — AB (ref 70–99)
Potassium: 4.1 mEq/L (ref 3.7–5.3)
SODIUM: 139 meq/L (ref 137–147)
Total Bilirubin: 0.7 mg/dL (ref 0.3–1.2)
Total Protein: 7.4 g/dL (ref 6.0–8.3)

## 2013-06-12 LAB — LIPASE, BLOOD: Lipase: 21 U/L (ref 11–59)

## 2013-06-12 MED ORDER — SODIUM CHLORIDE 0.9 % IV SOLN
INTRAVENOUS | Status: DC
  Administered 2013-06-12: 23:00:00 via INTRAVENOUS

## 2013-06-12 MED ORDER — IOHEXOL 300 MG/ML  SOLN: 25.0000 mL | Freq: Once | INTRAMUSCULAR | Status: AC | PRN

## 2013-06-12 MED ORDER — MORPHINE SULFATE 4 MG/ML IJ SOLN
4.0000 mg | Freq: Once | INTRAMUSCULAR | Status: AC
  Administered 2013-06-12: 4 mg via INTRAVENOUS
  Filled 2013-06-12: qty 1

## 2013-06-12 MED ORDER — ONDANSETRON HCL 4 MG/2ML IJ SOLN
4.0000 mg | Freq: Once | INTRAMUSCULAR | Status: AC
  Administered 2013-06-12: 4 mg via INTRAVENOUS
  Filled 2013-06-12: qty 2

## 2013-06-12 NOTE — ED Notes (Signed)
Pt denies nausea at this time.

## 2013-06-12 NOTE — Telephone Encounter (Signed)
Patient Information:  Caller Name: As[place]  Phone: [phone]  Patient: Sandra [name]  Gender: Female  DOB: [birth year]  Age: 63 Years  PCP: Viviana Simpler [facility])  Office Follow Up:  Does the office need to follow up with this patient?: No  Instructions For The Office: N/A  RN Note:  Vomiting w/ RLQ Pain, onset 1030 on 4-2. Pt had vomiting x1 on 4-2, describes RLQ pain has severe.  Pt has generalized weakness, denies dizziness, able to walk unassisted.  Pt is currently out of DM strips to check glucose.  Pt started Zocor on 3-25. Abdominal protocol used, ED dispo d/t severe abdominal pain. Consulted w/ Mearl Latin, RN d/t ED disp, RN consulted w/ MD, MD advised Pt to go to ED.  Pt verbalized understanding.  Symptoms  Reason For Call & Symptoms: Vomiting w/ RLQ Pain, onset 1030 on 4-2.  Reviewed Health History In EMR: Yes  Reviewed Medications In EMR: Yes  Reviewed Allergies In EMR: Yes  Reviewed Surgeries / Procedures: Yes  Date of Onset of Symptoms: 06/12/2013  Treatments Tried: Acetaminophen x1 extra strength at 12pm on 4-2  Treatments Tried Worked: No  Guideline(s) Used:  Abdominal Pain - Female  Disposition Per Guideline:   Go to ED Now  Reason For Disposition Reached:   Severe abdominal pain (e.g., excruciating)  Advice Given:  N/A  Patient Will Follow Care Advice:  YES

## 2013-06-12 NOTE — Telephone Encounter (Signed)
Pam with CAN said pt started today at 10:30 AM with severe constant rt lower abd pain; generalized weakness, vomited x 1 and diarrhea x 1. No fever or dizziness. Pt has not checked BS. Pt started Zocor on 06/04/13. CAN disposition is ED due to severe constant abd pain. Pt took Tylenol at 12 noon with no relief. Spoke with Dr Alla German CMA and advised Pam for pt to be eval at ED. Pam voiced understanding.

## 2013-06-12 NOTE — ED Notes (Signed)
Pt reports right side lower back pain that radiates around to right hip and groin. Denies injury to back or hx of back pain. Having n/v/d since the pain started, denies any urinary symptoms.

## 2013-06-12 NOTE — ED Notes (Signed)
CT notified that the pt has finished her contrast.  

## 2013-06-12 NOTE — ED Provider Notes (Signed)
CSN: [id number]     Arrival date & time 06/12/13  1800 History   First MD Initiated Contact with Patient 06/12/13 2136     Chief Complaint  Patient presents with  . Back Pain  . Abdominal Pain     (Consider location/radiation/quality/duration/timing/severity/associated sxs/prior Treatment) HPI Comments: Patient presents to the ER for evaluation of pain around her right side and hip region. It is sharp and constant. She has had nausea, vomiting and diarrhea with the symptoms. She has not noticed any fever.  Patient is a 63 y.o. female presenting with back pain and abdominal pain.  Back Pain Associated symptoms: abdominal pain   Abdominal Pain   Past Medical History  Diagnosis Date  . Hyperlipidemia   . Hypertension   . Arthritis   . Mitral valve prolapse   . Hyperplastic colon polyp   . Diabetes mellitus without complication    History reviewed. No pertinent past surgical history. Family History  Problem Relation Age of Onset  . Hypertension Mother   . Diabetes Father   . Hypertension Father   . Cancer Neg Hx     no breast or colon cancer  . Diabetes Brother   . Diabetes Brother   . Diabetes Sister    History  Substance Use Topics  . Smoking status: Never Smoker   . Smokeless tobacco: Never Used  . Alcohol Use: No   OB History   Grav Para Term Preterm Abortions TAB SAB Ect Mult Living                 Review of Systems  Gastrointestinal: Positive for abdominal pain.  Musculoskeletal: Positive for back pain.  All other systems reviewed and are negative.      Allergies  Lisinopril  Home Medications   Current Outpatient Rx  Name  Route  Sig  Dispense  Refill  . atenolol (TENORMIN) 100 MG tablet   Oral   Take 100 mg by mouth daily.         Marland Kitchen glipiZIDE (GLUCOTROL) 5 MG tablet   Oral   Take 2.5 mg by mouth 2 (two) times daily before a meal.         . metFORMIN (GLUCOPHAGE) 500 MG tablet   Oral   Take 1 tablet (500 mg total) by mouth 2 (two)  times daily with a meal.   60 tablet   11   . simvastatin (ZOCOR) 40 MG tablet   Oral   Take 1 tablet (40 mg total) by mouth at bedtime.   90 tablet   3   . triamterene-hydrochlorothiazide (MAXZIDE-25) 37.5-25 MG per tablet   Oral   Take 1 tablet by mouth daily.          BP 142/68  Pulse 53  Temp(Src) 98 F (36.7 C) (Oral)  Resp 18  Ht 5\' 3"  (1.6 m)  Wt 165 lb (74.844 kg)  BMI 29.24 kg/m2  SpO2 98% Physical Exam  Constitutional: She is oriented to person, place, and time. She appears well-developed and well-nourished. No distress.  HENT:  Head: Normocephalic and atraumatic.  Right Ear: Hearing normal.  Left Ear: Hearing normal.  Nose: Nose normal.  Mouth/Throat: Oropharynx is clear and moist and mucous membranes are normal.  Eyes: Conjunctivae and EOM are normal. Pupils are equal, round, and reactive to light.  Neck: Normal range of motion. Neck supple.  Cardiovascular: Regular rhythm, S1 normal and S2 normal.  Exam reveals no gallop and no friction rub.   No  murmur heard. Pulmonary/Chest: Effort normal and breath sounds normal. No respiratory distress. She exhibits no tenderness.  Abdominal: Soft. Normal appearance and bowel sounds are normal. There is no hepatosplenomegaly. There is no tenderness. There is no rebound, no guarding, no tenderness at McBurney's point and negative Murphy's sign. No hernia.    Musculoskeletal: Normal range of motion.  Neurological: She is alert and oriented to person, place, and time. She has normal strength. No cranial nerve deficit or sensory deficit. Coordination normal. GCS eye subscore is 4. GCS verbal subscore is 5. GCS motor subscore is 6.  Skin: Skin is warm, dry and intact. No rash noted. No cyanosis.  Psychiatric: She has a normal mood and affect. Her speech is normal and behavior is normal. Thought content normal.    ED Course  Procedures (including critical care time) Labs Review Labs Reviewed  COMPREHENSIVE METABOLIC  PANEL - Abnormal; Notable for the following:    Glucose, Bld 153 (*)    Calcium 11.1 (*)    GFR calc non Af Amer 68 (*)    GFR calc Af Amer 79 (*)    All other components within normal limits  CBC WITH DIFFERENTIAL - Abnormal; Notable for the following:    RBC 3.78 (*)    Hemoglobin 11.3 (*)    HCT 34.0 (*)    Neutrophils Relative % 85 (*)    All other components within normal limits  URINALYSIS, ROUTINE W REFLEX MICROSCOPIC - Abnormal; Notable for the following:    APPearance HAZY (*)    All other components within normal limits  LIPASE, BLOOD   Imaging Review Ct Abdomen Pelvis W Contrast  06/13/2013   CLINICAL DATA:  Right flank and right lower quadrant pain, nausea and vomiting.  EXAM: CT ABDOMEN AND PELVIS WITH CONTRAST  TECHNIQUE: Multidetector CT imaging of the abdomen and pelvis was performed using the standard protocol following bolus administration of intravenous contrast.  CONTRAST:  175mL OMNIPAQUE IOHEXOL 300 MG/ML  SOLN  COMPARISON:  US ABDOMEN COMPLETE dated 05/11/2010  FINDINGS: Included view of the lung bases are clear. Visualized heart and pericardium are unremarkable.  The liver, spleen, gallbladder, pancreas and adrenal glands are unremarkable.  Tiny hiatal hernia. The stomach, small and large bowel are normal in course and caliber without inflammatory changes. Enteric contrast has not yet reached the distal small bowel. Normal appendix. No intraperitoneal free fluid nor free air.  Moderate right hydronephrosis. Right perinephric stranding and delayed nephrogram, on delayed imaging there is no contrast excretion on the right consistent with dysfunction. Minimal hyperdense material seen in the proximal right ureter, coronal 35/78. No nephrolithiasis. No renal masses. Urinary bladder is partially distended and unremarkable.  Great vessels are normal in course and caliber. No lymphadenopathy by CT size criteria. Internal reproductive organs are unremarkable. Phleboliths in the  pelvis. Grade 1 L4-5 anterolisthesis on degenerative basis with resultant moderate neural foraminal narrowing, moderate neural foraminal narrowing Additionally at L5-S1. Tiny fat containing umbilical hernia.  IMPRESSION: Moderate right hydronephrosis with minimal hyperdense material within the proximal right ureter which may reflect tiny stones/debris, hematoma, or less likely, uroepithelial lesion. Decreased function of the right kidney, and mild perinephric stranding could reflect reactive effusion, less likely caliceal rupture.   Electronically Signed   By: Elon Alas   On: 06/13/2013 00:47     EKG Interpretation None      MDM   Final diagnoses:  None    Patient presents to the ER for evaluation of pain in  the right hip, flank and groin area. Her maximal tenderness, however, was on the bony prominence of the right anterior superior pelvic region. Lab work was unremarkable. Urinalysis equivocal. CAT scan was therefore performed to further evaluate. There is right-sided hydronephrosis. There is also perinephric stranding. There is small amount of hyperdense debris, possibly a stone. Patient is feeling better now, not experiencing any pain. She will therefore be discharged, given analgesia and empiric antibiotic coverage. Followup with urology as soon as possible. She was told to go to was in the emergency department if her pain worsens, she might need urologic intervention.    Orpah Greek, MD 06/13/13 [phone]

## 2013-06-13 ENCOUNTER — Encounter (HOSPITAL_COMMUNITY): Payer: Self-pay | Admitting: Radiology

## 2013-06-13 ENCOUNTER — Emergency Department (HOSPITAL_COMMUNITY): Payer: No Typology Code available for payment source

## 2013-06-13 MED ORDER — CEPHALEXIN 500 MG PO CAPS: 500.0000 mg | ORAL_CAPSULE | Freq: Four times a day (QID) | ORAL | Status: DC

## 2013-06-13 MED ORDER — IOHEXOL 300 MG/ML  SOLN
100.0000 mL | Freq: Once | INTRAMUSCULAR | Status: AC | PRN
  Administered 2013-06-13: 100 mL via INTRAVENOUS

## 2013-06-13 MED ORDER — OXYCODONE-ACETAMINOPHEN 5-325 MG PO TABS: 1.0000 | ORAL_TABLET | ORAL | Status: DC | PRN

## 2013-06-13 NOTE — Telephone Encounter (Signed)
Please check on her on Monday Looks like she passed a kidney stone

## 2013-06-13 NOTE — Discharge Instructions (Signed)
Hydronephrosis Hydronephrosis is an abnormal enlargement of your kidney. It can affect one or both the kidneys. It results from the backward pressure of urine on the kidneys, when the flow of urine is blocked. Normally, the urine drains from the kidney through the urine tube (ureter), into a sac which holds the urine until urination (bladder). When the urinary flow is blocked, the urine collects above the block. This causes an increase in the pressure inside the kidney, which in turn leads to its enlargement. The block can occur at the point where the kidney joins the ureter. Treatment depends on the cause and location of the block.  CAUSES  The causes of this condition include:  Birth defect of the kidney or ureter.  Kink at the point where the kidney joins the ureter.  Stones and blood clots in the kidney or ureter.  Cancer, injury, or infection of the ureter.  Scar tissue formation.  Backflow of urine (reflux).  Cancer of bladder or prostate gland.  Abnormality of the nerves or muscles of the kidney or ureter.  Lower part of the ureter protruding into the bladder (ureterocele).  Abnormal contractions of the bladder.  Both the kidneys can be affected during pregnancy. This is because the enlarging uterus presses on the ureters and blocks the flow of urine. SYMPTOMS  The symptoms depend on the location of the block. They also depend on how long the block has been present. You may feel pain on the affected side. Sometimes, you may not have any symptoms. There may be a dull ache or discomfort in the flank. The common symptoms are:  Flank pain.  Swelling of the abdomen.  Pain in the abdomen.  Nausea and vomiting.  Fever.  Pain while passing urine.  Urgency for urination.  Frequent or urgent urination.  Infection of the urinary tract. DIAGNOSIS  Your caregiver will examine you after asking about your symptoms. You may be asked to do blood and urine tests. Your caregiver  may order a special X-ray, ultrasound, or CT scan. Sometimes a rigid or flexible telescope (cystoscope) is used to view the site of the blockage.  TREATMENT  Treatment depends on the site, cause, and duration of the block. The goal of treatment is to remove the blockage. Your caregiver will plan the treatment based on your condition. The different types of treatment are:   Putting in a soft plastic tube (ureteral stent) to connect the bladder with the kidney. This will help in draining the urine.  Putting in a soft tube (nephrostomy tube). This is placed through skin into the kidney. The trapped urine is drained out through the back. A plastic bag is attached to your skin to hold the urine that has drained out.  Antibiotics to treat or prevent infection.  Breaking down of the stone (lithotripsy). HOME CARE INSTRUCTIONS   It may take some time for the hydronephrosis to go away (resolve). Drink fluids as directed by your caregiver , and get a lot of rest.  If you have a drain in, your caregiver will give you directions about how to care for it. Be sure you understand these directions completely before you go home.  Take any antibiotics, pain medications, or other prescriptions exactly as prescribed.  Follow-up with your caregivers as directed. SEEK MEDICAL CARE IF:   You continue to have flank pain, nausea, or difficulty with urination.  You have any problem with any type of drainage device.  Your urine becomes cloudy or bloody. SEEK  IMMEDIATE MEDICAL CARE IF:   You have severe flank and/or abdominal pain.  You develop vomiting and are unable to hold down fluids.  You develop a fever above 100.5 F (38.1 C), or as per your caregiver. MAKE SURE YOU:   Understand these instructions.  Will watch your condition.  Will get help right away if you are not doing well or get worse. Document Released: 12/25/2006 Document Revised: 05/22/2011 Document Reviewed: 02/10/2010 [facility]  Patient Information 2014 [place], [place].  Kidney Stones Kidney stones (urolithiasis) are deposits that form inside your kidneys. The intense pain is caused by the stone moving through the urinary tract. When the stone moves, the ureter goes into spasm around the stone. The stone is usually passed in the urine.  CAUSES   A disorder that makes certain neck glands produce too much parathyroid hormone (primary hyperparathyroidism).  A buildup of uric acid crystals, similar to gout in your joints.  Narrowing (stricture) of the ureter.  A kidney obstruction present at birth (congenital obstruction).  Previous surgery on the kidney or ureters.  Numerous kidney infections. SYMPTOMS   Feeling sick to your stomach (nauseous).  Throwing up (vomiting).  Blood in the urine (hematuria).  Pain that usually spreads (radiates) to the groin.  Frequency or urgency of urination. DIAGNOSIS   Taking a history and physical exam.  Blood or urine tests.  CT scan.  Occasionally, an examination of the inside of the urinary bladder (cystoscopy) is performed. TREATMENT   Observation.  Increasing your fluid intake.  Extracorporeal shock wave lithotripsy This is a noninvasive procedure that uses shock waves to break up kidney stones.  Surgery may be needed if you have severe pain or persistent obstruction. There are various surgical procedures. Most of the procedures are performed with the use of small instruments. Only small incisions are needed to accommodate these instruments, so recovery time is minimized. The size, location, and chemical composition are all important variables that will determine the proper choice of action for you. Talk to your health care provider to better understand your situation so that you will minimize the risk of injury to yourself and your kidney.  HOME CARE INSTRUCTIONS   Drink enough water and fluids to keep your urine clear or pale yellow. This will help you to  pass the stone or stone fragments.  Strain all urine through the provided strainer. Keep all particulate matter and stones for your health care provider to see. The stone causing the pain may be as small as a grain of salt. It is very important to use the strainer each and every time you pass your urine. The collection of your stone will allow your health care provider to analyze it and verify that a stone has actually passed. The stone analysis will often identify what you can do to reduce the incidence of recurrences.  Only take over-the-counter or prescription medicines for pain, discomfort, or fever as directed by your health care provider.  Make a follow-up appointment with your health care provider as directed.  Get follow-up X-rays if required. The absence of pain does not always mean that the stone has passed. It may have only stopped moving. If the urine remains completely obstructed, it can cause loss of kidney function or even complete destruction of the kidney. It is your responsibility to make sure X-rays and follow-ups are completed. Ultrasounds of the kidney can show blockages and the status of the kidney. Ultrasounds are not associated with any radiation and can be  performed easily in a matter of minutes. SEEK MEDICAL CARE IF:  You experience pain that is progressive and unresponsive to any pain medicine you have been prescribed. SEEK IMMEDIATE MEDICAL CARE IF:   Pain cannot be controlled with the prescribed medicine.  You have a fever or shaking chills.  The severity or intensity of pain increases over 18 hours and is not relieved by pain medicine.  You develop a new onset of abdominal pain.  You feel faint or pass out.  You are unable to urinate. MAKE SURE YOU:   Understand these instructions.  Will watch your condition.  Will get help right away if you are not doing well or get worse. Document Released: 02/27/2005 Document Revised: 10/30/2012 Document Reviewed:  07/31/2012 [facility] Patient Information 2014 [place].

## 2013-06-13 NOTE — ED Notes (Signed)
Pt returned from radiology.

## 2013-06-16 NOTE — Telephone Encounter (Signed)
The message on the phone states the person is not available now try again later

## 2013-06-17 NOTE — Telephone Encounter (Signed)
Pt number still not available, can not leave message

## 2013-06-26 ENCOUNTER — Telehealth: Payer: Self-pay

## 2013-06-26 NOTE — Telephone Encounter (Signed)
Pt was seen [place] on 06/12/13 with hydronephrosis and was advised to see [facility]. Pt cannot go to [facility] because her insurance is not accepted there. Pt request referral to another urology specialist that takes [place] ins. Pt request cb. Dr Silvio Pate is out of office; please advise.

## 2013-06-26 NOTE — Telephone Encounter (Signed)
LM with female who answered phone to have pt return my call

## 2013-06-26 NOTE — Telephone Encounter (Signed)
She needs to be see for hospital follow up and referral will be placed at that time

## 2013-07-01 ENCOUNTER — Telehealth: Payer: Self-pay | Admitting: Internal Medicine

## 2013-07-01 DIAGNOSIS — N1330 Unspecified hydronephrosis: Secondary | ICD-10-CM

## 2013-07-01 NOTE — Telephone Encounter (Signed)
Pt wants referral to a Neurologist. Says the one given to her in the ER is not covered with her insurance. Pt may be reached at sons cell phone [phone]. Thank you

## 2013-07-02 NOTE — Addendum Note (Signed)
Addended by: Viviana Simpler I on: 07/02/2013 07:40 AM   Modules accepted: Orders

## 2013-07-18 ENCOUNTER — Other Ambulatory Visit: Payer: Self-pay | Admitting: Internal Medicine

## 2013-08-18 ENCOUNTER — Other Ambulatory Visit: Payer: Self-pay | Admitting: Internal Medicine

## 2013-09-22 ENCOUNTER — Other Ambulatory Visit: Payer: Self-pay | Admitting: Internal Medicine

## 2013-09-24 ENCOUNTER — Other Ambulatory Visit: Payer: Self-pay | Admitting: Internal Medicine

## 2013-10-07 ENCOUNTER — Other Ambulatory Visit: Payer: Self-pay | Admitting: Internal Medicine

## 2013-10-24 ENCOUNTER — Encounter: Payer: No Typology Code available for payment source | Admitting: Internal Medicine

## 2013-12-06 ENCOUNTER — Other Ambulatory Visit: Payer: Self-pay | Admitting: Internal Medicine

## 2013-12-29 ENCOUNTER — Ambulatory Visit (INDEPENDENT_AMBULATORY_CARE_PROVIDER_SITE_OTHER): Payer: No Typology Code available for payment source | Admitting: Family Medicine

## 2013-12-29 ENCOUNTER — Encounter: Payer: Self-pay | Admitting: Family Medicine

## 2013-12-29 VITALS — BP 146/80 | HR 56 | Temp 98.2°F | Ht 61.5 in | Wt 180.2 lb

## 2013-12-29 DIAGNOSIS — M25561 Pain in right knee: Secondary | ICD-10-CM

## 2013-12-29 DIAGNOSIS — M1712 Unilateral primary osteoarthritis, left knee: Secondary | ICD-10-CM

## 2013-12-29 DIAGNOSIS — M25562 Pain in left knee: Secondary | ICD-10-CM

## 2013-12-29 MED ORDER — METHYLPREDNISOLONE ACETATE 40 MG/ML IJ SUSP
80.0000 mg | Freq: Once | INTRAMUSCULAR | Status: AC
  Administered 2013-12-29: 80 mg via INTRA_ARTICULAR

## 2013-12-29 NOTE — Progress Notes (Signed)
Pre visit review using our clinic review tool, if applicable. No additional management support is needed unless otherwise documented below in the visit note. 

## 2013-12-29 NOTE — Progress Notes (Signed)
Dr. Frederico Hamman T. Kasidee Voisin, MD, [facility] [place], [address] Phone: [phone] Fax: [phone]  12/29/2013  Patient: Sandra [name], MRN: [id number], DOB: 1951/01/05, 63 y.o.  Primary Physician:  Viviana Simpler, MD  Chief Complaint: Leg Pain  Subjective:   Sandra [name] is a 63 y.o. very pleasant female patient who presents with the following:  Left leg pain: L knee effusion.  Pressure and swollen in the knee on the left. No injury or accident. This is a very pleasant patient who I remember from previous encounters. She cleans houses for a living, and recently has developed a mild effusion. Pain with flexion and with squatting down. No trauma. No old fracture or surgery.  Occupation: Cleaning houses, trouble squatting down.    Past Medical History, Surgical History, Social History, Family History, Problem List, Medications, and Allergies have been reviewed and updated if relevant.  GEN: No fevers, chills. Nontoxic. Primarily MSK c/o today. MSK: Detailed in the HPI GI: tolerating PO intake without difficulty Neuro: No numbness, parasthesias, or tingling associated. Otherwise the pertinent positives of the ROS are noted above.   Objective:   BP 146/80  Pulse 56  Temp(Src) 98.2 F (36.8 C) (Oral)  Ht 5' 1.5" (1.562 m)  Wt 180 lb 4 oz (81.761 kg)  BMI 33.51 kg/m2   GEN: WDWN, NAD, Non-toxic, Alert & Oriented x 3 HEENT: Atraumatic, Normocephalic.  Ears and Nose: No external deformity. EXTR: No clubbing/cyanosis/edema NEURO: Normal gait.  PSYCH: Normally interactive. Conversant. Not depressed or anxious appearing.  Calm demeanor.   Knee:  L Gait: Normal heel toe pattern ROM: 0 - 100 Effusion: mild Echymosis or edema: none Patellar tendon NT Painful PLICA: neg Patellar grind: negative Medial and lateral patellar facet loading: negative medial and lateral joint lines: medial > lateral pain Mcmurray's  neg Flexion-pinch pain Varus and valgus stress: stable Lachman: neg Ant and Post drawer: neg Hip abduction, IR, ER: WNL Hip flexion str: 5/5 Hip abd: 5/5 Quad: 5/5 VMO atrophy:No Hamstring concentric and eccentric: 5/5   Radiology: No results found.  Assessment and Plan:   Primary osteoarthritis of left knee  Right knee pain - Plan: methylPREDNISolone acetate (DEPO-MEDROL) injection 80 mg   NOTE: I AM UNABLE TO REMOVE THE "RIGHT KNEE PAIN" CODE THAT WAS ENTERED IN ERROR BY STAFF, BUT THE PATIENT IS HAVING PRIMARY LEFT KNEE SYMPTOMS. I DO NOT KNOW HOW TO MANIPULATE OUR EHR TO CORRECT. Electronically Signed  By: Owens Loffler, MD On: 12/30/2013 10:21 AM    Motrin 600 - 800 mg recommended TID. (Over the counter Motrin, Advil, or Generic Ibuprofen 200 mg tablets. 3-4 tablets by mouth 3 times a day. This equals a prescription strength dose.)  Take Tylenol/Acetaminophen ES (500mg ) 2 tabs by mouth three times a day max as needed.   Ice Simple knee brace for work  Knee Injection, LEFT Patient verbally consented to procedure. Risks (including potential rare risk of infection), benefits, and alternatives explained. Sterilely prepped with Chloraprep. Ethyl cholride used for anesthesia. 8 cc Lidocaine 1% mixed with Depo-Medrol 80 mg injected using the anteromedial approach without difficulty. No complications with procedure and tolerated well. Patient had decreased pain post-injection.   Follow-up: prn  Signed,  Gershon Shorten T. Kaysen Deal, MD   Patient's Medications  New Prescriptions   No medications on file  Previous Medications   ATENOLOL (TENORMIN) 100 MG TABLET    TAKE 1 TABLET (100 MG TOTAL) BY MOUTH DAILY.  GLIPIZIDE (GLUCOTROL) 5 MG TABLET    TAKE 1 TABLET (5 MG TOTAL) BY MOUTH DAILY.   METFORMIN (GLUCOPHAGE) 500 MG TABLET    TAKE 1 TABLET (500 MG TOTAL) BY MOUTH 2 (TWO) TIMES DAILY WITH A MEAL.   TRIAMTERENE-HYDROCHLOROTHIAZIDE (MAXZIDE-25) 37.5-25 MG PER TABLET    Take 1 tablet  by mouth daily.  Modified Medications   No medications on file  Discontinued Medications   CEPHALEXIN (KEFLEX) 500 MG CAPSULE    Take 1 capsule (500 mg total) by mouth 4 (four) times daily.   OXYCODONE-ACETAMINOPHEN (PERCOCET) 5-325 MG PER TABLET    Take 1 tablet by mouth every 4 (four) hours as needed.   SIMVASTATIN (ZOCOR) 40 MG TABLET    Take 1 tablet (40 mg total) by mouth at bedtime.   TRIAMTERENE-HYDROCHLOROTHIAZIDE (MAXZIDE-25) 37.5-25 MG PER TABLET    TAKE 1 TABLET BY MOUTH DAILY.

## 2014-02-19 ENCOUNTER — Encounter (INDEPENDENT_AMBULATORY_CARE_PROVIDER_SITE_OTHER): Payer: PRIVATE HEALTH INSURANCE | Admitting: Ophthalmology

## 2014-02-19 DIAGNOSIS — I10 Essential (primary) hypertension: Secondary | ICD-10-CM

## 2014-02-19 DIAGNOSIS — H35033 Hypertensive retinopathy, bilateral: Secondary | ICD-10-CM

## 2014-02-19 DIAGNOSIS — H2513 Age-related nuclear cataract, bilateral: Secondary | ICD-10-CM

## 2014-02-19 DIAGNOSIS — E11329 Type 2 diabetes mellitus with mild nonproliferative diabetic retinopathy without macular edema: Secondary | ICD-10-CM

## 2014-02-19 DIAGNOSIS — H43813 Vitreous degeneration, bilateral: Secondary | ICD-10-CM

## 2014-02-19 DIAGNOSIS — E11319 Type 2 diabetes mellitus with unspecified diabetic retinopathy without macular edema: Secondary | ICD-10-CM

## 2014-02-19 LAB — HM DIABETES EYE EXAM

## 2014-03-11 ENCOUNTER — Ambulatory Visit (INDEPENDENT_AMBULATORY_CARE_PROVIDER_SITE_OTHER): Payer: No Typology Code available for payment source | Admitting: Internal Medicine

## 2014-03-11 ENCOUNTER — Other Ambulatory Visit (HOSPITAL_COMMUNITY)
Admission: RE | Admit: 2014-03-11 | Discharge: 2014-03-11 | Disposition: A | Discharge status: Home / Self Care | Payer: No Typology Code available for payment source | Source: Ambulatory Visit | Attending: Internal Medicine | Admitting: Internal Medicine

## 2014-03-11 ENCOUNTER — Encounter: Payer: Self-pay | Admitting: Internal Medicine

## 2014-03-11 VITALS — BP 130/70 | HR 62 | Temp 97.8°F | Ht 62.0 in | Wt 179.0 lb

## 2014-03-11 DIAGNOSIS — D649 Anemia, unspecified: Secondary | ICD-10-CM

## 2014-03-11 DIAGNOSIS — Z01419 Encounter for gynecological examination (general) (routine) without abnormal findings: Secondary | ICD-10-CM | POA: Diagnosis not present

## 2014-03-11 DIAGNOSIS — E11319 Type 2 diabetes mellitus with unspecified diabetic retinopathy without macular edema: Secondary | ICD-10-CM

## 2014-03-11 DIAGNOSIS — Z1151 Encounter for screening for human papillomavirus (HPV): Secondary | ICD-10-CM | POA: Diagnosis present

## 2014-03-11 DIAGNOSIS — E785 Hyperlipidemia, unspecified: Secondary | ICD-10-CM

## 2014-03-11 DIAGNOSIS — Z23 Encounter for immunization: Secondary | ICD-10-CM

## 2014-03-11 DIAGNOSIS — E210 Primary hyperparathyroidism: Secondary | ICD-10-CM

## 2014-03-11 DIAGNOSIS — I10 Essential (primary) hypertension: Secondary | ICD-10-CM

## 2014-03-11 DIAGNOSIS — Z0000 Encounter for general adult medical examination without abnormal findings: Secondary | ICD-10-CM

## 2014-03-11 LAB — COMPREHENSIVE METABOLIC PANEL
ALBUMIN: 4.3 g/dL (ref 3.5–5.2)
ALK PHOS: 60 U/L (ref 39–117)
ALT: 24 U/L (ref 0–35)
AST: 21 U/L (ref 0–37)
BUN: 16 mg/dL (ref 6–23)
CALCIUM: 11.2 mg/dL — AB (ref 8.4–10.5)
CO2: 28 mEq/L (ref 19–32)
Chloride: 104 mEq/L (ref 96–112)
Creatinine, Ser: 0.8 mg/dL (ref 0.4–1.2)
GFR: 91.9 mL/min (ref >60.00)
GLUCOSE: 97 mg/dL (ref 70–99)
POTASSIUM: 4.3 meq/L (ref 3.5–5.1)
Sodium: 139 mEq/L (ref 135–145)
TOTAL PROTEIN: 7.7 g/dL (ref 6.0–8.3)
Total Bilirubin: 0.9 mg/dL (ref 0.2–1.2)

## 2014-03-11 LAB — CBC WITH DIFFERENTIAL/PLATELET
BASOS PCT: 0.6 % (ref 0.0–3.0)
Basophils Absolute: 0 10*3/uL (ref 0.0–0.1)
Eosinophils Absolute: 0.2 10*3/uL (ref 0.0–0.7)
Eosinophils Relative: 3.7 % (ref 0.0–5.0)
HCT: 35.6 % — ABNORMAL LOW (ref 36.0–46.0)
Hemoglobin: 11.6 g/dL — ABNORMAL LOW (ref 12.0–15.0)
LYMPHS PCT: 29.6 % (ref 12.0–46.0)
Lymphs Abs: 1.6 10*3/uL (ref 0.7–4.0)
MCHC: 32.7 g/dL (ref 30.0–36.0)
MCV: 91 fl (ref 78.0–100.0)
MONO ABS: 0.4 10*3/uL (ref 0.1–1.0)
Monocytes Relative: 8.4 % (ref 3.0–12.0)
NEUTROS PCT: 57.7 % (ref 43.0–77.0)
Neutro Abs: 3.1 10*3/uL (ref 1.4–7.7)
PLATELETS: 284 10*3/uL (ref 150.0–400.0)
RBC: 3.92 Mil/uL (ref 3.87–5.11)
RDW: 14.5 % (ref 11.5–15.5)
WBC: 5.4 10*3/uL (ref 4.0–10.5)

## 2014-03-11 LAB — HEMOGLOBIN A1C: Hgb A1c MFr Bld: 6.4 % (ref 4.6–6.5)

## 2014-03-11 LAB — LIPID PANEL
Cholesterol: 249 mg/dL — ABNORMAL HIGH (ref 0–200)
HDL: 49.8 mg/dL (ref >39.00)
LDL CALC: 168 mg/dL — AB (ref 0–99)
NonHDL: 199.2
Total CHOL/HDL Ratio: 5
Triglycerides: 156 mg/dL — ABNORMAL HIGH (ref 0.0–149.0)
VLDL: 31.2 mg/dL (ref 0.0–40.0)

## 2014-03-11 LAB — MICROALBUMIN / CREATININE URINE RATIO
Creatinine,U: 56.8 mg/dL
MICROALB UR: 0.2 mg/dL (ref 0.0–1.9)
MICROALB/CREAT RATIO: 0.4 mg/g (ref 0.0–30.0)

## 2014-03-11 LAB — T4, FREE: Free T4: 0.82 ng/dL (ref 0.60–1.60)

## 2014-03-11 LAB — HM DIABETES FOOT EXAM

## 2014-03-11 MED ORDER — ZOSTER VACCINE LIVE 19400 UNT/0.65ML ~~LOC~~ SOLR: 0.6500 mL | Freq: Once | SUBCUTANEOUS | Status: DC

## 2014-03-11 NOTE — Assessment & Plan Note (Signed)
Bradycardic on the atenolol but doing well No ACEI/ARB due to angioedema  BP Readings from Last 3 Encounters:  03/11/14 130/70  12/29/13 146/80  06/13/13 137/66

## 2014-03-11 NOTE — Assessment & Plan Note (Signed)
Chronic elevated calcium She has no symptoms Doesn't want to pursue surgery for now Will check DEXA

## 2014-03-11 NOTE — Assessment & Plan Note (Signed)
No problems with statin Will check labs

## 2014-03-11 NOTE — Patient Instructions (Addendum)
Please set up your screening mammogram. You can try melatonin (over the counter)  5-10mg  at bedtime to help your sleep.

## 2014-03-11 NOTE — Assessment & Plan Note (Signed)
Still seems to have excellent control If persistent low sugar reactions, will decrease the glipizide

## 2014-03-11 NOTE — Assessment & Plan Note (Signed)
Chronic Will recheck

## 2014-03-11 NOTE — Progress Notes (Signed)
Pre visit review using our clinic review tool, if applicable. No additional management support is needed unless otherwise documented below in the visit note. 

## 2014-03-11 NOTE — Progress Notes (Signed)
Subjective:    Patient ID: Sandra [name], female    DOB: 01-Nov-1950, 63 y.o.   MRN: [id number]  HPI Here for physical and follow up of diabetes and other medical conditions  Ongoing problems with the arthritis Left knee--and has cramping in right knee Fluid has filled back up in the left knee Wears brace on knee and will even sleep in it at times [facility] okay with the brace  Checks sugars daily 80's to 110 Occasionally gets a sweat if in the 80's Recent eye exam---early bilateral retinopathy No foot pain, sores or numbness  Current Outpatient Prescriptions on File Prior to Visit  Medication Sig Dispense Refill  . atenolol (TENORMIN) 100 MG tablet TAKE 1 TABLET (100 MG TOTAL) BY MOUTH DAILY. 30 tablet 10  . glipiZIDE (GLUCOTROL) 5 MG tablet TAKE 1 TABLET (5 MG TOTAL) BY MOUTH DAILY. 30 tablet 11  . metFORMIN (GLUCOPHAGE) 500 MG tablet TAKE 1 TABLET (500 MG TOTAL) BY MOUTH 2 (TWO) TIMES DAILY WITH A MEAL. 60 tablet 10  . triamterene-hydrochlorothiazide (MAXZIDE-25) 37.5-25 MG per tablet Take 1 tablet by mouth daily.     No current facility-administered medications on file prior to visit.    Allergies  Allergen Reactions  . Lisinopril     REACTION: Angioedema    Past Medical History  Diagnosis Date  . Hyperlipidemia   . Hypertension   . Arthritis   . Mitral valve prolapse   . Hyperplastic colon polyp   . Diabetes mellitus without complication     No past surgical history on file.  Family History  Problem Relation Age of Onset  . Hypertension Mother   . Diabetes Father   . Hypertension Father   . Cancer Neg Hx     no breast or colon cancer  . Diabetes Brother   . Diabetes Brother   . Diabetes Sister     History   Social History  . Marital Status: Married    Spouse Name: N/A    Number of Children: 3  . Years of Education: N/A   Occupational History  . clean houses    Social History Main Topics  . Smoking status: Never Smoker   . Smokeless tobacco:  Never Used  . Alcohol Use: No  . Drug Use: Not on file  . Sexual Activity: Not on file   Other Topics Concern  . Not on file   Social History Narrative    Review of Systems  Constitutional: Negative for fatigue and unexpected weight change.       Wears seat belt  HENT: Negative for dental problem, hearing loss and tinnitus.        Due for dental exam  Eyes: Negative for visual disturbance.  Respiratory: Negative for cough, chest tightness and shortness of breath.   Cardiovascular: Negative for chest pain, palpitations and leg swelling.  Gastrointestinal: Positive for diarrhea. Negative for nausea, vomiting, abdominal pain and blood in stool.       Intermittent loose stools from metformin. Tolerates it. Uses imodium prn No heartburn  Endocrine: Negative for polydipsia and polyuria.  Genitourinary: Negative for dysuria, hematuria, difficulty urinating and dyspareunia.  Musculoskeletal: Positive for joint swelling and arthralgias. Negative for back pain.       Just knees  Skin: Negative for rash.  Allergic/Immunologic: Negative for environmental allergies and immunocompromised state.  Neurological: Positive for dizziness and numbness. Negative for syncope, weakness, light-headedness and headaches.       Dizziness only with low sugar  reactions. Some numbness in hands--from her job  Hematological: Negative for adenopathy. Does not bruise/bleed easily.  Psychiatric/Behavioral: Positive for sleep disturbance. Negative for dysphoric mood. The patient is not nervous/anxious.        Ongoing sleep problems--takes hours to initiate. No meds       Objective:   Physical Exam  Constitutional: She is oriented to person, place, and time. She appears well-developed and well-nourished. No distress.  HENT:  Head: Normocephalic and atraumatic.  Right Ear: External ear normal.  Left Ear: External ear normal.  Mouth/Throat: Oropharynx is clear and moist. No oropharyngeal exudate.  Eyes:  Conjunctivae and EOM are normal. Pupils are equal, round, and reactive to light.  Neck: Normal range of motion. Neck supple. No thyromegaly present.  Cardiovascular: Normal rate, regular rhythm, normal heart sounds and intact distal pulses.  Exam reveals no gallop.   No murmur heard. Pulmonary/Chest: Effort normal and breath sounds normal. No respiratory distress. She has no wheezes. She has no rales.  Abdominal: Soft. There is no tenderness.  Genitourinary:  Normal introitus Cervix limited visualization--pap done Bimanual limited but negative  Musculoskeletal: She exhibits no edema or tenderness.  Lymphadenopathy:    She has no cervical adenopathy.  Neurological: She is alert and oriented to person, place, and time.  Normal fine touch sensation on plantar feet  Skin: No rash noted. No erythema.  No foot lesions  Psychiatric: She has a normal mood and affect. Her behavior is normal.          Assessment & Plan:

## 2014-03-11 NOTE — Assessment & Plan Note (Signed)
Pap done today She needs to set up her mammogram Colonoscopy due 1/18 9(had only hyperplastic polyp in 2008) Discussed lifestyle

## 2014-03-11 NOTE — Addendum Note (Signed)
Addended by: Despina Hidden on: 03/11/2014 12:47 PM   Modules accepted: Orders

## 2014-03-12 LAB — PARATHYROID HORMONE, INTACT (NO CA): PTH: 113 pg/mL — ABNORMAL HIGH (ref 14–64)

## 2014-03-16 ENCOUNTER — Encounter: Payer: Self-pay | Admitting: *Deleted

## 2014-03-16 LAB — CYTOLOGY - PAP

## 2014-03-30 ENCOUNTER — Ambulatory Visit (INDEPENDENT_AMBULATORY_CARE_PROVIDER_SITE_OTHER)
Admission: RE | Admit: 2014-03-30 | Discharge: 2014-03-30 | Disposition: A | Discharge status: Home / Self Care | Payer: No Typology Code available for payment source | Source: Ambulatory Visit | Attending: Internal Medicine | Admitting: Internal Medicine

## 2014-03-30 DIAGNOSIS — E210 Primary hyperparathyroidism: Secondary | ICD-10-CM

## 2014-04-06 ENCOUNTER — Encounter: Payer: Self-pay | Admitting: *Deleted

## 2014-05-07 ENCOUNTER — Other Ambulatory Visit: Payer: Self-pay | Admitting: Internal Medicine

## 2014-05-20 ENCOUNTER — Telehealth: Payer: Self-pay

## 2014-05-20 NOTE — Telephone Encounter (Signed)
ophelia pts daughter left v/m; pt has appt with Dr Lorelei Pont on 05/27/14 at 8 AM and pt wants to know if there is anything can be given for leg problem until pt is seen. Tried to call # and no answer and no v/m.

## 2014-05-27 ENCOUNTER — Ambulatory Visit (INDEPENDENT_AMBULATORY_CARE_PROVIDER_SITE_OTHER): Payer: No Typology Code available for payment source | Admitting: Family Medicine

## 2014-05-27 ENCOUNTER — Encounter: Payer: Self-pay | Admitting: Family Medicine

## 2014-05-27 ENCOUNTER — Ambulatory Visit (INDEPENDENT_AMBULATORY_CARE_PROVIDER_SITE_OTHER)
Admission: RE | Admit: 2014-05-27 | Discharge: 2014-05-27 | Disposition: A | Discharge status: Home / Self Care | Payer: No Typology Code available for payment source | Source: Ambulatory Visit | Attending: Family Medicine | Admitting: Family Medicine

## 2014-05-27 VITALS — BP 124/76 | HR 63 | Temp 98.8°F | Ht 62.0 in | Wt 183.8 lb

## 2014-05-27 DIAGNOSIS — M25562 Pain in left knee: Secondary | ICD-10-CM

## 2014-05-27 DIAGNOSIS — M1712 Unilateral primary osteoarthritis, left knee: Secondary | ICD-10-CM

## 2014-05-27 MED ORDER — DICLOFENAC SODIUM 75 MG PO TBEC: 75.0000 mg | DELAYED_RELEASE_TABLET | Freq: Two times a day (BID) | ORAL | Status: DC

## 2014-05-27 NOTE — Progress Notes (Signed)
Pre visit review using our clinic review tool, if applicable. No additional management support is needed unless otherwise documented below in the visit note. 

## 2014-05-27 NOTE — Progress Notes (Signed)
Dr. Frederico Hamman T. Chidera Dearcos, MD, [facility] [place], [address] Phone: [phone] Fax: [phone]  05/27/2014  Patient: Sandra [name], MRN: [id number], DOB: 12-09-1950, 64 y.o.  Primary Physician:  Viviana Simpler, MD  Chief Complaint: Knee Pain  Subjective:   Sandra [name] is a 64 y.o. very pleasant female patient who presents with the following:  OA knee, L. Discussion. Pleasant patient with a history of knee arthritis that I saw in 12/2013. She is here asking me for some suggestions about her knee and for knee pain. At this point she does take some occasional Tylenol and occasional over-the-counter NSAIDs only. We did do one knee injection in the past. This brought her relief for a few months.  Past Medical History, Surgical History, Social History, Family History, Problem List, Medications, and Allergies have been reviewed and updated if relevant.  GEN: No fevers, chills. Nontoxic. Primarily MSK c/o today. MSK: Detailed in the HPI GI: tolerating PO intake without difficulty Neuro: No numbness, parasthesias, or tingling associated. Otherwise the pertinent positives of the ROS are noted above.   Objective:   BP 124/76 mmHg  Pulse 63  Temp(Src) 98.8 F (37.1 C) (Oral)  Ht 5\' 2"  (1.575 m)  Wt 183 lb 12 oz (83.348 kg)  BMI 33.60 kg/m2   GEN: WDWN, NAD, Non-toxic, Alert & Oriented x 3 HEENT: Atraumatic, Normocephalic.  Ears and Nose: No external deformity. EXTR: No clubbing/cyanosis/edema NEURO: Normal gait.  PSYCH: Normally interactive. Conversant. Not depressed or anxious appearing.  Calm demeanor.   Knee:  R Gait: Normal heel toe pattern ROM: 0-115 Effusion: mild Echymosis or edema: none Patellar tendon NT Painful PLICA: neg Patellar grind: negative Medial and lateral patellar facet loading: ttp medial and lateral joint lines: tender medially Mcmurray's neg Flexion-pinch pain Varus and valgus stress:  stable Lachman: neg Ant and Post drawer: neg Hip abduction, IR, ER: WNL Hip flexion str: 5/5 Hip abd: 5/5 Quad: 5/5 VMO atrophy:No Hamstring concentric and eccentric: 5/5   Radiology: X-rays: AP Weight-bearing, Weightbearing Lateral, Sunrise views Indication: knee pain Findings:  There is tricompartmental arthritis with greatest arthritic changes in the medial compartment with significant loss of joint space. Overall, moderate osteoarthritic change, greatest in the medial compartment. No fracture or dislocation. The radiological images were independently reviewed by myself in the office and results were reviewed with the patient. My independent interpretation of images. Electronically Signed  By: Owens Loffler, MD On: 05/27/2014 3:23 PM   Assessment and Plan:   Left knee pain - Plan: DG Knee AP/LAT W/Sunrise Left  Primary osteoarthritis of left knee  We discussed treatment strategies including: Tylenol on a routine bases, 2 tablets up to 3-4 times a day For very bad days, May take Voltaren During an acute flare, intraarticular corticosteroids can be helpful. Hyaluronic Acid injections also have had good success in treating grade I - III OA, not grade IV Modified impact physical activity can often help Also, Capzaicin cream has very good data in OA Voltaren Gel is another option, as well. An off-loader brace can be helpful in certain cases.   Follow-up: Return if symptoms worsen or fail to improve.  New Prescriptions   DICLOFENAC (VOLTAREN) 75 MG EC TABLET    Take 1 tablet (75 mg total) by mouth 2 (two) times daily.   Orders Placed This Encounter  Procedures  . DG Knee AP/LAT W/Sunrise Left    Signed,  Pallas Wahlert T. Dauntae Derusha, MD  Patient's Medications  New Prescriptions   DICLOFENAC (VOLTAREN) 75 MG EC TABLET    Take 1 tablet (75 mg total) by mouth 2 (two) times daily.  Previous Medications   ATENOLOL (TENORMIN) 100 MG TABLET    TAKE 1 TABLET (100 MG TOTAL) BY MOUTH  DAILY.   GLIPIZIDE (GLUCOTROL) 5 MG TABLET    TAKE 1 TABLET (5 MG TOTAL) BY MOUTH DAILY.   METFORMIN (GLUCOPHAGE) 500 MG TABLET    TAKE 1 TABLET (500 MG TOTAL) BY MOUTH 2 (TWO) TIMES DAILY WITH A MEAL.   TRIAMTERENE-HYDROCHLOROTHIAZIDE (MAXZIDE-25) 37.5-25 MG PER TABLET    Take 1 tablet by mouth daily.   ZOSTER VACCINE LIVE, PF, (ZOSTAVAX) 62831 UNT/0.65ML INJECTION    Inject 19,400 Units into the skin once.  Modified Medications   No medications on file  Discontinued Medications   TRIAMTERENE-HYDROCHLOROTHIAZIDE (MAXZIDE-25) 37.5-25 MG PER TABLET    TAKE 1 TABLET BY MOUTH DAILY.

## 2014-05-27 NOTE — Telephone Encounter (Signed)
Pt was seen today.

## 2014-08-12 ENCOUNTER — Other Ambulatory Visit: Payer: Self-pay | Admitting: Internal Medicine

## 2014-08-17 ENCOUNTER — Other Ambulatory Visit: Payer: Self-pay | Admitting: Internal Medicine

## 2014-09-11 ENCOUNTER — Ambulatory Visit: Payer: PRIVATE HEALTH INSURANCE | Admitting: Internal Medicine

## 2014-09-11 DIAGNOSIS — Z0289 Encounter for other administrative examinations: Secondary | ICD-10-CM

## 2014-09-30 ENCOUNTER — Encounter: Payer: Self-pay | Admitting: Internal Medicine

## 2014-09-30 ENCOUNTER — Encounter: Payer: Self-pay | Admitting: *Deleted

## 2014-09-30 ENCOUNTER — Ambulatory Visit (INDEPENDENT_AMBULATORY_CARE_PROVIDER_SITE_OTHER): Payer: Self-pay | Admitting: Internal Medicine

## 2014-09-30 VITALS — BP 130/70 | HR 71 | Temp 98.3°F | Wt 184.0 lb

## 2014-09-30 DIAGNOSIS — E785 Hyperlipidemia, unspecified: Secondary | ICD-10-CM

## 2014-09-30 DIAGNOSIS — E11319 Type 2 diabetes mellitus with unspecified diabetic retinopathy without macular edema: Secondary | ICD-10-CM

## 2014-09-30 DIAGNOSIS — K219 Gastro-esophageal reflux disease without esophagitis: Secondary | ICD-10-CM | POA: Insufficient documentation

## 2014-09-30 DIAGNOSIS — E210 Primary hyperparathyroidism: Secondary | ICD-10-CM

## 2014-09-30 LAB — RENAL FUNCTION PANEL
ALBUMIN: 4 g/dL (ref 3.5–5.2)
BUN: 14 mg/dL (ref 6–23)
CO2: 30 mEq/L (ref 19–32)
CREATININE: 0.87 mg/dL (ref 0.40–1.20)
Calcium: 10.5 mg/dL (ref 8.4–10.5)
Chloride: 104 mEq/L (ref 96–112)
GFR: 84.48 mL/min (ref >60.00)
GLUCOSE: 108 mg/dL — AB (ref 70–99)
PHOSPHORUS: 2.1 mg/dL — AB (ref 2.3–4.6)
Potassium: 3.8 mEq/L (ref 3.5–5.1)
Sodium: 139 mEq/L (ref 135–145)

## 2014-09-30 LAB — HEMOGLOBIN A1C: Hgb A1c MFr Bld: 6.2 % (ref 4.6–6.5)

## 2014-09-30 MED ORDER — OMEPRAZOLE 20 MG PO CPDR: 20.0000 mg | DELAYED_RELEASE_CAPSULE | Freq: Every day | ORAL | Status: DC

## 2014-09-30 MED ORDER — ATORVASTATIN CALCIUM 20 MG PO TABS: 20.0000 mg | ORAL_TABLET | Freq: Every day | ORAL | Status: DC

## 2014-09-30 NOTE — Progress Notes (Signed)
Subjective:    Patient ID: Sandra [name], female    DOB: January 15, 1951, 64 y.o.   MRN: [id number]  HPI Here for follow up of diabetes and other chronic medical conditions  Doing well Usually checks sugars once or twice a week Always under 100 fasting-- only 1 hypoglycemic spell. Went to 30 and she had to eat something quickly No foot sores or pain  Having cramps in right calf Notices it at night-- has to be careful not to move quickly Tried the statin--but couldn't even walk due to severe pain  Knee pain is somewhat better Only uses the diclofenac occasionally  Gets tight feeling in chest and up to neck Can be at rest  Seems more related to after eating and notices it at night No heartburn Uses tums but not much help Doesn't feel like it is her heart---not exertional. Walks 2 miles every morning and never has symptoms then No swallowing problems Breathing is okay No dizziness or syncope  Current Outpatient Prescriptions on File Prior to Visit  Medication Sig Dispense Refill  . atenolol (TENORMIN) 100 MG tablet TAKE 1 TABLET (100 MG TOTAL) BY MOUTH DAILY. 90 tablet 3  . diclofenac (VOLTAREN) 75 MG EC tablet Take 1 tablet (75 mg total) by mouth 2 (two) times daily. 60 tablet 3  . glipiZIDE (GLUCOTROL) 5 MG tablet TAKE 1 TABLET (5 MG TOTAL) BY MOUTH DAILY. 30 tablet 11  . metFORMIN (GLUCOPHAGE) 500 MG tablet TAKE 1 TABLET (500 MG TOTAL) BY MOUTH 2 (TWO) TIMES DAILY WITH A MEAL. 60 tablet 0  . triamterene-hydrochlorothiazide (MAXZIDE-25) 37.5-25 MG per tablet Take 1 tablet by mouth daily.     No current facility-administered medications on file prior to visit.    Allergies  Allergen Reactions  . Lisinopril     REACTION: Angioedema  . Pravastatin Other (See Comments)    Severe myalgias  . Simvastatin Other (See Comments)    myalgias    Past Medical History  Diagnosis Date  . Hyperlipidemia   . Hypertension   . Arthritis   . Mitral valve prolapse   . Hyperplastic  colon polyp   . Diabetes mellitus without complication     No past surgical history on file.  Family History  Problem Relation Age of Onset  . Hypertension Mother   . Diabetes Father   . Hypertension Father   . Cancer Neg Hx     no breast or colon cancer  . Diabetes Brother   . Diabetes Brother   . Diabetes Sister     History   Social History  . Marital Status: Married    Spouse Name: N/A  . Number of Children: 3  . Years of Education: N/A   Occupational History  . clean houses    Social History Main Topics  . Smoking status: Never Smoker   . Smokeless tobacco: Never Used  . Alcohol Use: No  . Drug Use: Not on file  . Sexual Activity: Not on file   Other Topics Concern  . Not on file   Social History Narrative   Review of Systems Sleeping better with melatonin 5mg  nightly Appetite is good Weight is stable    Objective:   Physical Exam  Constitutional: She appears well-developed and well-nourished. No distress.  Neck: Normal range of motion. Neck supple. No thyromegaly present.  Cardiovascular: Normal rate, regular rhythm, normal heart sounds and intact distal pulses.  Exam reveals no gallop.   No murmur heard. Pulmonary/Chest: Effort  normal and breath sounds normal. No respiratory distress. She has no wheezes. She has no rales.  Abdominal: Soft. There is no tenderness.  Musculoskeletal: She exhibits no edema or tenderness.  Lymphadenopathy:    She has no cervical adenopathy.  Psychiatric: She has a normal mood and affect. Her behavior is normal.          Assessment & Plan:

## 2014-09-30 NOTE — Patient Instructions (Signed)
Please set up your screening mammogram. Start omeprazole daily on an empty stomach ---for the tightness which seems to be acid related. Let me know if you get muscle pain on the new cholesterol medication.

## 2014-09-30 NOTE — Progress Notes (Signed)
Pre visit review using our clinic review tool, if applicable. No additional management support is needed unless otherwise documented below in the visit note. 

## 2014-09-30 NOTE — Assessment & Plan Note (Signed)
Still seems to be well controlled Due for A1c

## 2014-09-30 NOTE — Assessment & Plan Note (Signed)
Not sure if muscle symptoms are related Will recheck levels

## 2014-09-30 NOTE — Assessment & Plan Note (Signed)
Will try one last statin

## 2014-09-30 NOTE — Assessment & Plan Note (Signed)
Chest tightness at night and after eating clearly seems to be reflux related Will try omeprazole

## 2014-10-01 ENCOUNTER — Telehealth: Payer: Self-pay

## 2014-10-01 NOTE — Telephone Encounter (Signed)
Was unable to leave a message on both patient's cell and home number for patient to return my call about having a Mammogram set up.

## 2014-10-06 ENCOUNTER — Other Ambulatory Visit: Payer: Self-pay | Admitting: Internal Medicine

## 2014-10-10 ENCOUNTER — Other Ambulatory Visit: Payer: Self-pay | Admitting: Internal Medicine

## 2015-01-05 ENCOUNTER — Telehealth: Payer: Self-pay | Admitting: *Deleted

## 2015-01-05 ENCOUNTER — Encounter: Payer: Self-pay | Admitting: *Deleted

## 2015-01-05 NOTE — Telephone Encounter (Signed)
Both numbers in chart are wrong, have been disconnected and no longer in service. Will mail patient a letter.

## 2015-01-05 NOTE — Telephone Encounter (Signed)
-----   Message from Venia Carbon, MD sent at 01/05/2015  7:35 AM EDT ----- Please see if she is still taking the statin, and have her come in if she is for lipid and hepatic ----- Message -----    From: SYSTEM    Sent: 01/05/2015  12:04 AM      To: Venia Carbon, MD

## 2015-01-18 ENCOUNTER — Other Ambulatory Visit: Payer: Self-pay | Admitting: Internal Medicine

## 2015-01-18 DIAGNOSIS — E785 Hyperlipidemia, unspecified: Secondary | ICD-10-CM

## 2015-01-20 ENCOUNTER — Other Ambulatory Visit (INDEPENDENT_AMBULATORY_CARE_PROVIDER_SITE_OTHER): Payer: Self-pay

## 2015-01-20 DIAGNOSIS — E785 Hyperlipidemia, unspecified: Secondary | ICD-10-CM

## 2015-01-20 LAB — HEPATIC FUNCTION PANEL
ALT: 32 U/L (ref 0–35)
AST: 27 U/L (ref 0–37)
Albumin: 4.1 g/dL (ref 3.5–5.2)
Alkaline Phosphatase: 74 U/L (ref 39–117)
BILIRUBIN DIRECT: 0.2 mg/dL (ref 0.0–0.3)
BILIRUBIN TOTAL: 0.9 mg/dL (ref 0.2–1.2)
Total Protein: 7.1 g/dL (ref 6.0–8.3)

## 2015-01-20 LAB — LIPID PANEL
CHOL/HDL RATIO: 4
Cholesterol: 154 mg/dL (ref 0–200)
HDL: 43 mg/dL (ref >39.00)
LDL CALC: 89 mg/dL (ref 0–99)
NonHDL: 111.19
TRIGLYCERIDES: 112 mg/dL (ref 0.0–149.0)
VLDL: 22.4 mg/dL (ref 0.0–40.0)

## 2015-01-21 ENCOUNTER — Encounter: Payer: Self-pay | Admitting: *Deleted

## 2015-02-26 ENCOUNTER — Ambulatory Visit (INDEPENDENT_AMBULATORY_CARE_PROVIDER_SITE_OTHER): Payer: PRIVATE HEALTH INSURANCE | Admitting: Ophthalmology

## 2015-04-04 ENCOUNTER — Other Ambulatory Visit: Payer: Self-pay | Admitting: Internal Medicine

## 2015-07-06 ENCOUNTER — Other Ambulatory Visit: Payer: Self-pay | Admitting: Internal Medicine

## 2015-07-10 ENCOUNTER — Other Ambulatory Visit: Payer: Self-pay | Admitting: Internal Medicine

## 2015-09-10 ENCOUNTER — Other Ambulatory Visit: Payer: Self-pay | Admitting: Internal Medicine

## 2015-09-13 ENCOUNTER — Other Ambulatory Visit: Payer: Self-pay | Admitting: Internal Medicine

## 2015-10-03 ENCOUNTER — Other Ambulatory Visit: Payer: Self-pay | Admitting: Internal Medicine

## 2015-10-07 ENCOUNTER — Other Ambulatory Visit: Payer: Self-pay | Admitting: Internal Medicine

## 2015-10-10 ENCOUNTER — Other Ambulatory Visit: Payer: Self-pay | Admitting: Internal Medicine

## 2015-10-11 ENCOUNTER — Other Ambulatory Visit: Payer: Self-pay

## 2015-10-19 ENCOUNTER — Ambulatory Visit (INDEPENDENT_AMBULATORY_CARE_PROVIDER_SITE_OTHER): Payer: BLUE CROSS/BLUE SHIELD | Admitting: Internal Medicine

## 2015-10-19 ENCOUNTER — Encounter: Payer: Self-pay | Admitting: Internal Medicine

## 2015-10-19 VITALS — BP 126/80 | HR 46 | Temp 98.0°F | Wt 180.0 lb

## 2015-10-19 DIAGNOSIS — E785 Hyperlipidemia, unspecified: Secondary | ICD-10-CM | POA: Diagnosis not present

## 2015-10-19 DIAGNOSIS — E210 Primary hyperparathyroidism: Secondary | ICD-10-CM | POA: Diagnosis not present

## 2015-10-19 DIAGNOSIS — I10 Essential (primary) hypertension: Secondary | ICD-10-CM | POA: Diagnosis not present

## 2015-10-19 DIAGNOSIS — E11311 Type 2 diabetes mellitus with unspecified diabetic retinopathy with macular edema: Secondary | ICD-10-CM

## 2015-10-19 LAB — COMPREHENSIVE METABOLIC PANEL
ALK PHOS: 69 U/L (ref 39–117)
ALT: 20 U/L (ref 0–35)
AST: 17 U/L (ref 0–37)
Albumin: 4.4 g/dL (ref 3.5–5.2)
BUN: 20 mg/dL (ref 6–23)
CO2: 30 mEq/L (ref 19–32)
Calcium: 11.3 mg/dL — ABNORMAL HIGH (ref 8.4–10.5)
Chloride: 103 mEq/L (ref 96–112)
Creatinine, Ser: 0.97 mg/dL (ref 0.40–1.20)
GFR: 74.26 mL/min (ref >60.00)
GLUCOSE: 84 mg/dL (ref 70–99)
POTASSIUM: 4.2 meq/L (ref 3.5–5.1)
Sodium: 140 mEq/L (ref 135–145)
TOTAL PROTEIN: 7.5 g/dL (ref 6.0–8.3)
Total Bilirubin: 0.8 mg/dL (ref 0.2–1.2)

## 2015-10-19 LAB — CBC WITH DIFFERENTIAL/PLATELET
Basophils Absolute: 0 10*3/uL (ref 0.0–0.1)
Basophils Relative: 0.3 % (ref 0.0–3.0)
EOS PCT: 4 % (ref 0.0–5.0)
Eosinophils Absolute: 0.2 10*3/uL (ref 0.0–0.7)
HEMATOCRIT: 35.3 % — AB (ref 36.0–46.0)
HEMOGLOBIN: 11.8 g/dL — AB (ref 12.0–15.0)
LYMPHS ABS: 1.6 10*3/uL (ref 0.7–4.0)
LYMPHS PCT: 35 % (ref 12.0–46.0)
MCHC: 33.4 g/dL (ref 30.0–36.0)
MCV: 87.7 fl (ref 78.0–100.0)
MONOS PCT: 12 % (ref 3.0–12.0)
Monocytes Absolute: 0.5 10*3/uL (ref 0.1–1.0)
NEUTROS PCT: 48.7 % (ref 43.0–77.0)
Neutro Abs: 2.2 10*3/uL (ref 1.4–7.7)
Platelets: 266 10*3/uL (ref 150.0–400.0)
RBC: 4.03 Mil/uL (ref 3.87–5.11)
RDW: 14.9 % (ref 11.5–15.5)
WBC: 4.5 10*3/uL (ref 4.0–10.5)

## 2015-10-19 LAB — LIPID PANEL
CHOL/HDL RATIO: 3
CHOLESTEROL: 147 mg/dL (ref 0–200)
HDL: 42.2 mg/dL (ref >39.00)
LDL Cholesterol: 86 mg/dL (ref 0–99)
NonHDL: 104.51
TRIGLYCERIDES: 95 mg/dL (ref 0.0–149.0)
VLDL: 19 mg/dL (ref 0.0–40.0)

## 2015-10-19 LAB — MICROALBUMIN / CREATININE URINE RATIO
Creatinine,U: 109.8 mg/dL
MICROALB/CREAT RATIO: 0.6 mg/g (ref 0.0–30.0)
Microalb, Ur: <0.7 mg/dL (ref 0.0–1.9)

## 2015-10-19 LAB — T4, FREE: Free T4: 0.64 ng/dL (ref 0.60–1.60)

## 2015-10-19 LAB — HEMOGLOBIN A1C: Hgb A1c MFr Bld: 6.6 % — ABNORMAL HIGH (ref 4.6–6.5)

## 2015-10-19 LAB — HM DIABETES FOOT EXAM

## 2015-10-19 NOTE — Patient Instructions (Addendum)
Please set up your diabetic eye exam. Please set up your screening mammogram.

## 2015-10-19 NOTE — Assessment & Plan Note (Signed)
Some low sugar reactions If A1c still low--will cut glipizide in half Needs eye exam

## 2015-10-19 NOTE — Assessment & Plan Note (Signed)
BP Readings from Last 3 Encounters:  10/19/15 126/80  09/30/14 130/70  05/27/14 124/76   Good control No change needed

## 2015-10-19 NOTE — Assessment & Plan Note (Signed)
Still taking the atorvastatin Notes some leg pain--so doesn't take every day. (usually will skip 3 days in a row if needed)

## 2015-10-19 NOTE — Assessment & Plan Note (Signed)
Last calcium normal Will recheck

## 2015-10-19 NOTE — Progress Notes (Signed)
Subjective:    Patient ID: Sandra [name], female    DOB: 29-May-1950, 65 y.o.   MRN: [id number]  HPI Here for follow up of diabetes and other chronic health conditions  Doing well in general Has some swelling in hands and feet Seems more prominent in morning or evening Avoids salt No SOB No chest pain No syncope but gets some dizzy times  Doesn't check sugar Gets some hypoglycemic reactions--mild and once a week or so Overdue for eye exam No foot numbness, pain or sores  Current Outpatient Prescriptions on File Prior to Visit  Medication Sig Dispense Refill  . atenolol (TENORMIN) 100 MG tablet TAKE 1 TABLET (100 MG TOTAL) BY MOUTH DAILY. 30 tablet 0  . atorvastatin (LIPITOR) 20 MG tablet TAKE 1 TABLET (20 MG TOTAL) BY MOUTH DAILY. 30 tablet 3  . diclofenac (VOLTAREN) 75 MG EC tablet Take 1 tablet (75 mg total) by mouth 2 (two) times daily. 60 tablet 3  . glipiZIDE (GLUCOTROL) 5 MG tablet TAKE 1 TABLET (5 MG TOTAL) BY MOUTH DAILY. 30 tablet 1  . metFORMIN (GLUCOPHAGE) 500 MG tablet TAKE 1 TABLET (500 MG TOTAL) BY MOUTH 2 (TWO) TIMES DAILY WITH A MEAL. 60 tablet 0  . omeprazole (PRILOSEC) 20 MG capsule TAKE 1 CAPSULE (20 MG TOTAL) BY MOUTH DAILY. 30 capsule 1  . triamterene-hydrochlorothiazide (MAXZIDE-25) 37.5-25 MG tablet TAKE 1 TABLET BY MOUTH DAILY. 90 tablet 3   No current facility-administered medications on file prior to visit.     Allergies  Allergen Reactions  . Lisinopril     REACTION: Angioedema  . Pravastatin Other (See Comments)    Severe myalgias  . Simvastatin Other (See Comments)    myalgias    Past Medical History:  Diagnosis Date  . Arthritis   . Diabetes mellitus without complication (Learned)   . GERD (gastroesophageal reflux disease)   . Hyperlipidemia   . Hyperplastic colon polyp   . Hypertension   . Mitral valve prolapse     No past surgical history on file.  Family History  Problem Relation Age of Onset  . Hypertension Mother   . Diabetes  Father   . Hypertension Father   . Cancer Neg Hx     no breast or colon cancer  . Diabetes Brother   . Diabetes Brother   . Diabetes Sister     Social History   Social History  . Marital status: Married    Spouse name: N/A  . Number of children: 3  . Years of education: N/A   Occupational History  . clean houses    Social History Main Topics  . Smoking status: Never Smoker  . Smokeless tobacco: Never Used  . Alcohol use No  . Drug use: Unknown  . Sexual activity: Not on file   Other Topics Concern  . Not on file   Social History Narrative  . No narrative on file   Review of Systems Tries to walk and exercise regularly In PT for leg-- prescribed by sports medicine specialist Sleeps okay--uses melatonin (3mg ) prn Appetite is okay Weight down a few pounds    Objective:   Physical Exam  Constitutional: She appears well-developed and well-nourished. No distress.  Neck: Normal range of motion. Neck supple. No thyromegaly present.  Cardiovascular: Normal rate, regular rhythm, normal heart sounds and intact distal pulses.  Exam reveals no gallop.   No murmur heard. Pulmonary/Chest: Effort normal and breath sounds normal. No respiratory distress. She has no  wheezes. She has no rales.  Abdominal: Soft. There is no tenderness.  Lymphadenopathy:    She has no cervical adenopathy.  Neurological:  Normal sensation in feet  Skin: No rash noted. No erythema.  No foot lesions  Psychiatric: She has a normal mood and affect.          Assessment & Plan:

## 2015-10-19 NOTE — Progress Notes (Signed)
Pre visit review using our clinic review tool, if applicable. No additional management support is needed unless otherwise documented below in the visit note. 

## 2015-11-21 ENCOUNTER — Other Ambulatory Visit: Payer: Self-pay | Admitting: Internal Medicine

## 2015-11-24 ENCOUNTER — Telehealth: Payer: Self-pay

## 2015-11-24 MED ORDER — METOPROLOL SUCCINATE ER 100 MG PO TB24: 100.0000 mg | ORAL_TABLET | Freq: Every day | ORAL | 4 refills | Status: DC

## 2015-11-24 NOTE — Telephone Encounter (Signed)
Received a fax from [facility] stating all strengths of Atenolol are on manufacturer backorder. Please make changes accordingly.

## 2015-11-24 NOTE — Telephone Encounter (Signed)
New medication sent to [facility] electronically. Left message for pt to call back.

## 2015-11-24 NOTE — Telephone Encounter (Signed)
Okay to change to metoprolol succinate 100mg  daily Have patient let me know if she has any problems with this switch (let her know that I actually like this medication better than the atenolol)

## 2015-11-24 NOTE — Telephone Encounter (Signed)
Spoke to pt

## 2015-12-06 ENCOUNTER — Other Ambulatory Visit: Payer: Self-pay | Admitting: Internal Medicine

## 2015-12-07 ENCOUNTER — Other Ambulatory Visit: Payer: Self-pay | Admitting: Internal Medicine

## 2015-12-08 NOTE — Telephone Encounter (Signed)
Opened in error

## 2015-12-16 ENCOUNTER — Other Ambulatory Visit: Payer: Self-pay | Admitting: Internal Medicine

## 2016-02-02 ENCOUNTER — Encounter: Payer: Self-pay | Admitting: Gastroenterology

## 2016-02-29 ENCOUNTER — Encounter: Payer: Self-pay | Admitting: Gastroenterology

## 2016-04-17 ENCOUNTER — Other Ambulatory Visit: Payer: Self-pay | Admitting: Internal Medicine

## 2016-04-24 ENCOUNTER — Encounter: Payer: Self-pay | Admitting: Gastroenterology

## 2016-04-24 ENCOUNTER — Ambulatory Visit (AMBULATORY_SURGERY_CENTER): Payer: Self-pay

## 2016-04-24 VITALS — Ht 62.5 in | Wt 183.4 lb

## 2016-04-24 DIAGNOSIS — Z1211 Encounter for screening for malignant neoplasm of colon: Secondary | ICD-10-CM

## 2016-04-24 MED ORDER — SUPREP BOWEL PREP KIT 17.5-3.13-1.6 GM/177ML PO SOLN: 1.0000 | Freq: Once | ORAL | 0 refills | Status: AC

## 2016-04-24 NOTE — Progress Notes (Signed)
No diet meds No home oxygen No past problems with anesthesia No allergies to eggs or soy  Declined emmi 

## 2016-04-25 ENCOUNTER — Ambulatory Visit (INDEPENDENT_AMBULATORY_CARE_PROVIDER_SITE_OTHER): Payer: BLUE CROSS/BLUE SHIELD | Admitting: Internal Medicine

## 2016-04-25 ENCOUNTER — Encounter: Payer: Self-pay | Admitting: Internal Medicine

## 2016-04-25 VITALS — BP 136/88 | HR 56 | Temp 98.5°F | Ht 62.0 in | Wt 181.0 lb

## 2016-04-25 DIAGNOSIS — E210 Primary hyperparathyroidism: Secondary | ICD-10-CM | POA: Diagnosis not present

## 2016-04-25 DIAGNOSIS — I10 Essential (primary) hypertension: Secondary | ICD-10-CM | POA: Diagnosis not present

## 2016-04-25 DIAGNOSIS — E1140 Type 2 diabetes mellitus with diabetic neuropathy, unspecified: Secondary | ICD-10-CM | POA: Insufficient documentation

## 2016-04-25 DIAGNOSIS — E1142 Type 2 diabetes mellitus with diabetic polyneuropathy: Secondary | ICD-10-CM | POA: Diagnosis not present

## 2016-04-25 DIAGNOSIS — I059 Rheumatic mitral valve disease, unspecified: Secondary | ICD-10-CM | POA: Diagnosis not present

## 2016-04-25 DIAGNOSIS — E11311 Type 2 diabetes mellitus with unspecified diabetic retinopathy with macular edema: Secondary | ICD-10-CM | POA: Diagnosis not present

## 2016-04-25 DIAGNOSIS — Z0000 Encounter for general adult medical examination without abnormal findings: Secondary | ICD-10-CM

## 2016-04-25 DIAGNOSIS — Z23 Encounter for immunization: Secondary | ICD-10-CM

## 2016-04-25 LAB — RENAL FUNCTION PANEL
ALBUMIN: 4.3 g/dL (ref 3.5–5.2)
BUN: 18 mg/dL (ref 6–23)
CO2: 30 mEq/L (ref 19–32)
CREATININE: 0.79 mg/dL (ref 0.40–1.20)
Calcium: 10.9 mg/dL — ABNORMAL HIGH (ref 8.4–10.5)
Chloride: 103 mEq/L (ref 96–112)
GFR: 93.96 mL/min (ref >60.00)
GLUCOSE: 92 mg/dL (ref 70–99)
POTASSIUM: 4.1 meq/L (ref 3.5–5.1)
Phosphorus: 2.2 mg/dL — ABNORMAL LOW (ref 2.3–4.6)
SODIUM: 138 meq/L (ref 135–145)

## 2016-04-25 LAB — HEMOGLOBIN A1C: HEMOGLOBIN A1C: 6.7 % — AB (ref 4.6–6.5)

## 2016-04-25 NOTE — Assessment & Plan Note (Signed)
Getting colonoscopy this month Will be setting up mammogram Pap done last year Discussed fitness--trying to exercise

## 2016-04-25 NOTE — Patient Instructions (Addendum)
Please set up your diabetic eye exam and screening mammogram. Please let me know if you have frequent or severe low sugar reactions (I will have to cut back on the glipizide).

## 2016-04-25 NOTE — Progress Notes (Signed)
Pre visit review using our clinic review tool, if applicable. No additional management support is needed unless otherwise documented below in the visit note. 

## 2016-04-25 NOTE — Assessment & Plan Note (Signed)
Still seems okay with control Will have to cut back on glipizide if hypoglyemia is sig

## 2016-04-25 NOTE — Assessment & Plan Note (Signed)
BP Readings from Last 3 Encounters:  04/25/16 136/88  10/19/15 126/80  09/30/14 130/70   Good control now

## 2016-04-25 NOTE — Progress Notes (Signed)
Subjective:    Patient ID: Sandra [name], female    DOB: 1950/03/30, 66 y.o.   MRN: [id number]  HPI Here for physical  Doing fairly well Reviewed past history of MVP--- put in by prior clinician but she is not aware Has only had palpitations No echo so I am not sure about this diagnosis  Is having her colonoscopy soon  She is concerned about needing a fluid pill Hopes it would help her weight Gets tight in hands in AM Some leg swelling if prolonged sitting Discussed she is on diuretic for her HTN Does avoid salt  Monitors her diabetes Checks sugars occasionally Gets frequent hypoglycemic feelings Eats regularly though No recent eye exam---awaiting Medicare Still some mild hand numbness and also in toes  Current Outpatient Prescriptions on File Prior to Visit  Medication Sig Dispense Refill  . atorvastatin (LIPITOR) 20 MG tablet TAKE 1 TABLET (20 MG TOTAL) BY MOUTH DAILY. 30 tablet 3  . glipiZIDE (GLUCOTROL) 5 MG tablet TAKE 1 TABLET (5 MG TOTAL) BY MOUTH DAILY. 30 tablet 4  . metFORMIN (GLUCOPHAGE) 500 MG tablet TAKE 1 TABLET (500 MG TOTAL) BY MOUTH 2 (TWO) TIMES DAILY WITH A MEAL. 60 tablet 4  . metoprolol succinate (TOPROL-XL) 100 MG 24 hr tablet TAKE ONE TABLET BY MOUTH DAILY WITH OR FOLLOWING A MEAL 30 tablet 1  . omeprazole (PRILOSEC) 20 MG capsule TAKE 1 CAPSULE (20 MG TOTAL) BY MOUTH DAILY. 30 capsule 11  . triamterene-hydrochlorothiazide (MAXZIDE-25) 37.5-25 MG tablet TAKE 1 TABLET BY MOUTH DAILY. 90 tablet 3   No current facility-administered medications on file prior to visit.     Allergies  Allergen Reactions  . Lisinopril     REACTION: Angioedema  . Pravastatin Other (See Comments)    Severe myalgias  . Simvastatin Other (See Comments)    myalgias    Past Medical History:  Diagnosis Date  . Arthritis   . Diabetes mellitus without complication ([place])   . GERD (gastroesophageal reflux disease)   . Hyperlipidemia   . Hyperplastic colon polyp   .  Hypertension     No past surgical history on file.  Family History  Problem Relation Age of Onset  . Hypertension Mother   . Diabetes Father   . Hypertension Father   . Diabetes Brother   . Heart disease Brother   . Diabetes Brother   . Diabetes Sister   . Heart disease Daughter   . Cancer Neg Hx     no breast or colon cancer  . Colon cancer Neg Hx     Social History   Social History  . Marital status: Married    Spouse name: N/A  . Number of children: 3  . Years of education: N/A   Occupational History  . clean houses    Social History Main Topics  . Smoking status: Never Smoker  . Smokeless tobacco: Never Used  . Alcohol use No  . Drug use: No  . Sexual activity: Not on file   Other Topics Concern  . Not on file   Social History Narrative  . No narrative on file   Review of Systems  Constitutional: Positive for fatigue. Negative for unexpected weight change.       Wears seat belt  HENT: Negative for dental problem and hearing loss.        Plans visit with dentist soon  Eyes: Negative for visual disturbance.       No diplopia or unilateral vision  loss  Respiratory: Negative for cough, chest tightness and shortness of breath.   Cardiovascular: Positive for leg swelling. Negative for palpitations.       Will get occasional pressure feeling in the chest--relates to the change from atenolol to metoprolol (tingling like)  Gastrointestinal: Negative for blood in stool, constipation, nausea and vomiting.  Endocrine: Negative for polydipsia and polyuria.  Genitourinary: Negative for difficulty urinating, dyspareunia, dysuria and hematuria.  Musculoskeletal: Positive for arthralgias. Negative for joint swelling.       Hip and leg pain-- uses ibuprofen daily (helps some) Tylenol makes her sleepy  Skin: Negative for rash.  Allergic/Immunologic: Negative for environmental allergies and immunocompromised state.  Neurological: Positive for numbness. Negative for  dizziness, syncope and headaches.  Hematological: Negative for adenopathy. Does not bruise/bleed easily.  Psychiatric/Behavioral: Negative for sleep disturbance.       Sleeps well with melatonin       Objective:   Physical Exam  Constitutional: She is oriented to person, place, and time. She appears well-developed and well-nourished. No distress.  HENT:  Head: Normocephalic and atraumatic.  Right Ear: External ear normal.  Left Ear: External ear normal.  Mouth/Throat: Oropharynx is clear and moist. No oropharyngeal exudate.  Eyes: Conjunctivae are normal.  Neck: Normal range of motion. Neck supple. No thyromegaly present.  Cardiovascular: Normal rate, regular rhythm, normal heart sounds and intact distal pulses.  Exam reveals no gallop.   No murmur heard. Pulmonary/Chest: Effort normal and breath sounds normal. No respiratory distress. She has no wheezes. She has no rales.  Abdominal: Soft. There is no tenderness.  Musculoskeletal: She exhibits no edema or tenderness.  Lymphadenopathy:    She has no cervical adenopathy.  Neurological: She is alert and oriented to person, place, and time.  Slightly decreased sensation in feet  Skin: No rash noted. No erythema.  No foot lesions  Psychiatric: She has a normal mood and affect. Her behavior is normal.          Assessment & Plan:

## 2016-04-25 NOTE — Assessment & Plan Note (Signed)
No sig pain in feet May also have CTS---discussed splints at night

## 2016-04-25 NOTE — Assessment & Plan Note (Signed)
Has vague heart symptoms but no definitive diagnosis No murmur--so not functionally important (no antibiotics anyway)

## 2016-04-25 NOTE — Assessment & Plan Note (Signed)
Will recheck labs Will set up DEXA for when on Medicare

## 2016-04-25 NOTE — Addendum Note (Signed)
Addended by: Pilar Grammes on: 04/25/2016 12:53 PM   Modules accepted: Orders

## 2016-04-26 LAB — PARATHYROID HORMONE, INTACT (NO CA): PTH: 132 pg/mL — ABNORMAL HIGH (ref 14–64)

## 2016-05-02 ENCOUNTER — Other Ambulatory Visit: Payer: Self-pay | Admitting: Internal Medicine

## 2016-05-08 ENCOUNTER — Ambulatory Visit (AMBULATORY_SURGERY_CENTER): Payer: BLUE CROSS/BLUE SHIELD | Admitting: Gastroenterology

## 2016-05-08 ENCOUNTER — Encounter: Payer: Self-pay | Admitting: Gastroenterology

## 2016-05-08 VITALS — BP 137/84 | HR 51 | Temp 96.9°F | Resp 19 | Ht 62.5 in | Wt 183.0 lb

## 2016-05-08 DIAGNOSIS — D122 Benign neoplasm of ascending colon: Secondary | ICD-10-CM

## 2016-05-08 DIAGNOSIS — D120 Benign neoplasm of cecum: Secondary | ICD-10-CM

## 2016-05-08 DIAGNOSIS — D123 Benign neoplasm of transverse colon: Secondary | ICD-10-CM

## 2016-05-08 DIAGNOSIS — K635 Polyp of colon: Secondary | ICD-10-CM

## 2016-05-08 DIAGNOSIS — Z1212 Encounter for screening for malignant neoplasm of rectum: Secondary | ICD-10-CM | POA: Diagnosis not present

## 2016-05-08 DIAGNOSIS — Z1211 Encounter for screening for malignant neoplasm of colon: Secondary | ICD-10-CM

## 2016-05-08 MED ORDER — SODIUM CHLORIDE 0.9 % IV SOLN: 500.0000 mL | INTRAVENOUS | Status: DC

## 2016-05-08 NOTE — Progress Notes (Signed)
Pt's states no medical or surgical changes since previsit or office visit.maw  Pt ate Sunday am brown rice at 08:30 & a chicken salad sandwich at 13:30.  No solid foods after 13:30.  Pt reported she drank all of her suprep last night, did not completed bottle this am, all but approximately 5 oz this am.  She reported last BM was clear green liquid that she could see through.  This was reported to Ernst Spell, CRNA

## 2016-05-08 NOTE — Patient Instructions (Addendum)
YOU HAD AN ENDOSCOPIC PROCEDURE TODAY AT [facility]:   Refer to the procedure report that was given to you for any specific questions about what was found during the examination.  If the procedure report does not answer your questions, please call your gastroenterologist to clarify.  If you requested that your care partner not be given the details of your procedure findings, then the procedure report has been included in a sealed envelope for you to review at your convenience later.  YOU SHOULD EXPECT: Some feelings of bloating in the abdomen. Passage of more gas than usual.  Walking can help get rid of the air that was put into your GI tract during the procedure and reduce the bloating. If you had a lower endoscopy (such as a colonoscopy or flexible sigmoidoscopy) you may notice spotting of blood in your stool or on the toilet paper. If you underwent a bowel prep for your procedure, you may not have a normal bowel movement for a few days.  Please Note:  You might notice some irritation and congestion in your nose or some drainage.  This is from the oxygen used during your procedure.  There is no need for concern and it should clear up in a day or so.  SYMPTOMS TO REPORT IMMEDIATELY:   Following lower endoscopy (colonoscopy or flexible sigmoidoscopy):  Excessive amounts of blood in the stool  Significant tenderness or worsening of abdominal pains  Swelling of the abdomen that is new, acute  Fever of 100F or higher   For urgent or emergent issues, a gastroenterologist can be reached at any hour by calling [phone].   DIET:  We do recommend a small meal at first, but then you may proceed to your regular diet.  Drink plenty of fluids but you should avoid alcoholic beverages for 24 hours.  ACTIVITY:  You should plan to take it easy for the rest of today and you should NOT DRIVE or use heavy machinery until tomorrow (because of the sedation medicines used during the test).     FOLLOW UP: Our staff will call the number listed on your records the next business day following your procedure to check on you and address any questions or concerns that you may have regarding the information given to you following your procedure. If we do not reach you, we will leave a message.  However, if you are feeling well and you are not experiencing any problems, there is no need to return our call.  We will assume that you have returned to your regular daily activities without incident.  If any biopsies were taken you will be contacted by phone or by letter within the next 1-3 weeks.  Please call us at [phone] if you have not heard about the biopsies in 3 weeks.    SIGNATURES/CONFIDENTIALITY: You and/or your care partner have signed paperwork which will be entered into your electronic medical record.  These signatures attest to the fact that that the information above on your After Visit Summary has been reviewed and is understood.  Full responsibility of the confidentiality of this discharge information lies with you and/or your care-partner.   No ibuprofen,naproxen,or other non-steroidal anti-inflammatory drugs for 2 weeks,resume remainder of medications. Information given on polyps,hemorrhoids and high fiber diet.

## 2016-05-08 NOTE — Progress Notes (Signed)
To PACU, vss patent aw report to rn 

## 2016-05-08 NOTE — Op Note (Signed)
[facility] Patient Name: Sandra [name] Procedure Date: 05/08/2016 8:24 AM MRN: [id number] Endoscopist: Remo Lipps P. Armbruster MD, MD Age: 66 Referring MD:  Date of Birth: 1950/08/19 Gender: Female Account #: [id number] Procedure:                Colonoscopy Indications:              Screening for colorectal malignant neoplasm Medicines:                Monitored Anesthesia Care Procedure:                Pre-Anesthesia Assessment:                           - Prior to the procedure, a History and Physical                            was performed, and patient medications and                            allergies were reviewed. The patient's tolerance of                            previous anesthesia was also reviewed. The risks                            and benefits of the procedure and the sedation                            options and risks were discussed with the patient.                            All questions were answered, and informed consent                            was obtained. Prior Anticoagulants: The patient has                            taken no previous anticoagulant or antiplatelet                            agents. ASA Grade Assessment: III - A patient with                            severe systemic disease. After reviewing the risks                            and benefits, the patient was deemed in                            satisfactory condition to undergo the procedure.                           After obtaining informed consent, the colonoscope  was passed under direct vision. Throughout the                            procedure, the patient's blood pressure, pulse, and                            oxygen saturations were monitored continuously. The                            Model CF-HQ190L [id number]) scope was introduced                            through the anus and advanced to the the cecum,   identified by appendiceal orifice and ileocecal                            valve. The colonoscopy was performed without                            difficulty. The patient tolerated the procedure                            well. The quality of the bowel preparation was                            adequate. The quality of the bowel preparation was                            adequate. The ileocecal valve, appendiceal orifice,                            and rectum were photographed. Scope In: 8:28:00 AM Scope Out: 8:44:30 AM Scope Withdrawal Time: 0 hours 13 minutes 5 seconds  Total Procedure Duration: 0 hours 16 minutes 30 seconds  Findings:                 The perianal and digital rectal examinations were                            normal.                           A 5 mm polyp was found in the ascending colon. The                            polyp was flat. The polyp was removed with a cold                            snare. Resection and retrieval were complete.                           A 5 mm polyp was found in the transverse colon. The  polyp was sessile. The polyp was removed with a                            cold snare. Resection and retrieval were complete.                           A 4 mm polyp was found in the splenic flexure. The                            polyp was sessile. The polyp was removed with a                            cold snare. Resection and retrieval were complete.                           Internal hemorrhoids were found during retroflexion.                           The exam was otherwise without abnormality. Complications:            No immediate complications. Estimated blood loss:                            Minimal. Estimated Blood Loss:     Estimated blood loss was minimal. Impression:               - One 5 mm polyp in the ascending colon, removed                            with a cold snare. Resected and retrieved.                            - One 5 mm polyp in the transverse colon, removed                            with a cold snare. Resected and retrieved.                           - One 4 mm polyp at the splenic flexure, removed                            with a cold snare. Resected and retrieved.                           - Internal hemorrhoids.                           - The examination was otherwise normal. Recommendation:           - Patient has a contact number available for                            emergencies. The signs and symptoms of potential  delayed complications were discussed with the                            patient. Return to normal activities tomorrow.                            Written discharge instructions were provided to the                            patient.                           - Resume previous diet.                           - Continue present medications.                           - No ibuprofen, naproxen, or other non-steroidal                            anti-inflammatory drugs for 2 weeks after polyp                            removal.                           - Await pathology results.                           - Repeat colonoscopy for surveillance pending                            pathology results. Remo Lipps P. Armbruster MD, MD 05/08/2016 8:48:19 AM This report has been signed electronically.

## 2016-05-08 NOTE — Progress Notes (Signed)
Called to room to assist during endoscopic procedure.  Patient ID and intended procedure confirmed with present staff. Received instructions for my participation in the procedure from the performing physician.  

## 2016-05-09 ENCOUNTER — Telehealth: Payer: Self-pay | Admitting: *Deleted

## 2016-05-09 NOTE — Telephone Encounter (Signed)
  Follow up Call-  Call back number 05/08/2016  Post procedure Call Back phone  # [phone] cell  Permission to leave phone message Yes  Some recent data might be hidden     Patient questions:  Do you have a fever, pain , or abdominal swelling? No. Pain Score  0 *  Have you tolerated food without any problems? Yes.    Have you been able to return to your normal activities? Yes.    Do you have any questions about your discharge instructions: Diet   No. Medications  No. Follow up visit  No.  Do you have questions or concerns about your Care? No.  Actions: * If pain score is 4 or above: No action needed, pain <4.

## 2016-05-12 ENCOUNTER — Encounter: Payer: Self-pay | Admitting: Gastroenterology

## 2016-05-13 ENCOUNTER — Other Ambulatory Visit: Payer: Self-pay | Admitting: Internal Medicine

## 2016-05-15 ENCOUNTER — Ambulatory Visit (INDEPENDENT_AMBULATORY_CARE_PROVIDER_SITE_OTHER): Payer: BLUE CROSS/BLUE SHIELD | Admitting: Family Medicine

## 2016-05-15 ENCOUNTER — Encounter: Payer: Self-pay | Admitting: Family Medicine

## 2016-05-15 VITALS — BP 160/84 | HR 59 | Temp 98.0°F | Wt 183.0 lb

## 2016-05-15 DIAGNOSIS — M25552 Pain in left hip: Secondary | ICD-10-CM

## 2016-05-15 MED ORDER — MELOXICAM 7.5 MG PO TABS: 7.5000 mg | ORAL_TABLET | Freq: Every day | ORAL | 0 refills | Status: DC | PRN

## 2016-05-15 NOTE — Progress Notes (Signed)
Subjective:    Patient ID: Sandra [name], female    DOB: [birth year], 66 y.o.   MRN: [id number]  HPI This is a 66 yo who presents today with left sided pain hip x 2 months. Had similar pain in past and required a shot. Has been seen at [facility] and underwent PT, had some relief. Continues to do home exercises. Has known spinal stenosis. Has been taking tylenol without much relief. Pain worse in the morning, sometimes can't do her exercises due to pain. Takes tylenol at night with some relief. Night time cramping improved with compression socks. No recent falls. 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  Has had MRI of back, but only plain films of hips from 2007- normal except for mild joint space narrowing in right hip.  Past Medical History:  Diagnosis Date  . Arthritis   . Diabetes mellitus without complication ([place])   . GERD (gastroesophageal reflux disease)   . Hyperlipidemia   . Hyperplastic colon polyp   . Hypertension    No past surgical history on file. Family History  Problem Relation Age of Onset  . Hypertension Mother   . Diabetes Father   . Hypertension Father   . Diabetes Brother   . Heart disease Brother   . Diabetes Brother   . Diabetes Sister   . Heart disease Daughter   . Cancer Neg Hx     no breast or colon cancer  . Colon cancer Neg Hx   . Esophageal cancer Neg Hx   . Pancreatic cancer Neg Hx   . Rectal cancer Neg Hx   . Stomach cancer Neg Hx    Social History  Substance Use Topics  . Smoking status: Never Smoker  . Smokeless tobacco: Never Used  . Alcohol use No      Review of Systems Per HPI    Objective:   Physical Exam  Constitutional: She is oriented to person, place, and time. She appears well-developed and well-nourished. No distress.  Obese.   HENT:  Head: Normocephalic and atraumatic.  Eyes: Conjunctivae are normal.  Neck: Normal range of motion. Neck supple.  Cardiovascular:  Normal rate and regular rhythm.   Pulmonary/Chest: Effort normal.  Musculoskeletal:       Left hip: She exhibits tenderness (with abduction and over gluteus). She exhibits normal range of motion, normal strength and no bony tenderness.  Neurological: She is alert and oriented to person, place, and time.  Skin: Skin is warm and dry. She is not diaphoretic.  Psychiatric: She has a normal mood and affect. Her behavior is normal. Judgment and thought content normal.  Vitals reviewed.     BP (!) 160/84 (BP Location: Left Arm, Patient Position: Sitting, Cuff Size: Normal)   Pulse (!) 59   Temp 98 F (36.7 C) (Oral)   Wt 183 lb (83 kg)   SpO2 98%   BMI 32.94 kg/m  Wt Readings from Last 3 Encounters:  05/15/16 183 lb (83 kg)  05/08/16 183 lb (83 kg)  04/25/16 181 lb (82.1 kg)   BP Readings from Last 3 Encounters:  05/15/16 (!) 160/84  05/08/16 137/84  04/25/16 136/88       Assessment & Plan:  1. Left hip pain - recurrent problem, may need additional imaging and possible injection, she will continue her home exercises, add heat/ice prn and follow up with either her orthopedist or can see Dr. Lorelei Pont (sports  medicine) here.  - meloxicam (MOBIC) 7.5 MG tablet; Take 1 tablet (7.5 mg total) by mouth daily as needed for pain.  Dispense: 30 tablet; Refill: 0   Clarene Reamer, FNP-BC  [facility]  05/17/2016 9:52 AM

## 2016-05-15 NOTE — Patient Instructions (Signed)
Hip Pain The hip is the joint between the upper legs and the lower pelvis. The bones, cartilage, tendons, and muscles of your hip joint support your body and allow you to move around. Hip pain can range from a minor ache to severe pain in one or both of your hips. The pain may be felt on the inside of the hip joint near the groin, or the outside near the buttocks and upper thigh. You may also have swelling or stiffness. Follow these instructions at home: Managing pain, stiffness, and swelling   If directed, apply ice to the injured area.  Put ice in a plastic bag.  Place a towel between your skin and the bag.  Leave the ice on for 20 minutes, 2-3 times a day  Sleep with a pillow between your legs on your most comfortable side.  Avoid any activities that cause pain. General instructions   Take over-the-counter and prescription medicines only as told by your health care provider.  Do any exercises as told by your health care provider.  Record the following:  How often you have hip pain.  The location of your pain.  What the pain feels like.  What makes the pain worse.  Keep all follow-up visits as told by your health care provider. This is important. Contact a health care provider if:  You cannot put weight on your leg.  Your pain or swelling continues or gets worse after one week.  It gets harder to walk.  You have a fever. Get help right away if:  You fall.  You have a sudden increase in pain and swelling in your hip.  Your hip is red or swollen or very tender to touch. Summary  Hip pain can range from a minor ache to severe pain in one or both of your hips.  The pain may be felt on the inside of the hip joint near the groin, or the outside near the buttocks and upper thigh.  Avoid any activities that cause pain.  Record how often you have hip pain, the location of the pain, what makes it worse and what it feels like. This information is not intended to  replace advice given to you by your health care provider. Make sure you discuss any questions you have with your health care provider. Document Released: 08/17/2009 Document Revised: 01/31/2016 Document Reviewed: 01/31/2016 Elsevier Interactive Patient Education  2017 [employer]  

## 2016-05-15 NOTE — Progress Notes (Signed)
Pre visit review using our clinic review tool, if applicable. No additional management support is needed unless otherwise documented below in the visit note. 

## 2016-05-26 ENCOUNTER — Other Ambulatory Visit: Payer: Self-pay | Admitting: Internal Medicine

## 2016-06-16 ENCOUNTER — Other Ambulatory Visit: Payer: Self-pay | Admitting: Internal Medicine

## 2016-06-29 ENCOUNTER — Other Ambulatory Visit: Payer: Self-pay | Admitting: Internal Medicine

## 2016-10-16 ENCOUNTER — Other Ambulatory Visit: Payer: Self-pay | Admitting: Internal Medicine

## 2016-10-16 DIAGNOSIS — Z1231 Encounter for screening mammogram for malignant neoplasm of breast: Secondary | ICD-10-CM

## 2016-10-23 ENCOUNTER — Ambulatory Visit: Payer: BLUE CROSS/BLUE SHIELD

## 2016-11-08 ENCOUNTER — Ambulatory Visit (INDEPENDENT_AMBULATORY_CARE_PROVIDER_SITE_OTHER): Payer: Medicare Other | Admitting: Internal Medicine

## 2016-11-08 ENCOUNTER — Encounter: Payer: Self-pay | Admitting: Internal Medicine

## 2016-11-08 VITALS — BP 140/88 | HR 76 | Temp 97.9°F | Wt 179.0 lb

## 2016-11-08 DIAGNOSIS — R200 Anesthesia of skin: Secondary | ICD-10-CM | POA: Diagnosis not present

## 2016-11-08 DIAGNOSIS — E11311 Type 2 diabetes mellitus with unspecified diabetic retinopathy with macular edema: Secondary | ICD-10-CM | POA: Diagnosis not present

## 2016-11-08 DIAGNOSIS — R5383 Other fatigue: Secondary | ICD-10-CM

## 2016-11-08 DIAGNOSIS — I10 Essential (primary) hypertension: Secondary | ICD-10-CM

## 2016-11-08 DIAGNOSIS — E210 Primary hyperparathyroidism: Secondary | ICD-10-CM | POA: Diagnosis not present

## 2016-11-08 LAB — HEMOGLOBIN A1C: HEMOGLOBIN A1C: 7.1 % — AB (ref 4.6–6.5)

## 2016-11-08 LAB — CBC WITH DIFFERENTIAL/PLATELET
BASOS PCT: 0.6 % (ref 0.0–3.0)
Basophils Absolute: 0 10*3/uL (ref 0.0–0.1)
EOS ABS: 0.2 10*3/uL (ref 0.0–0.7)
Eosinophils Relative: 4 % (ref 0.0–5.0)
HEMATOCRIT: 34.7 % — AB (ref 36.0–46.0)
HEMOGLOBIN: 11.4 g/dL — AB (ref 12.0–15.0)
LYMPHS PCT: 35.8 % (ref 12.0–46.0)
Lymphs Abs: 1.4 10*3/uL (ref 0.7–4.0)
MCHC: 32.9 g/dL (ref 30.0–36.0)
MCV: 89.3 fl (ref 78.0–100.0)
Monocytes Absolute: 0.4 10*3/uL (ref 0.1–1.0)
Monocytes Relative: 9.7 % (ref 3.0–12.0)
Neutro Abs: 1.9 10*3/uL (ref 1.4–7.7)
Neutrophils Relative %: 49.9 % (ref 43.0–77.0)
Platelets: 232 10*3/uL (ref 150.0–400.0)
RBC: 3.89 Mil/uL (ref 3.87–5.11)
RDW: 14.7 % (ref 11.5–15.5)
WBC: 3.9 10*3/uL — AB (ref 4.0–10.5)

## 2016-11-08 LAB — COMPREHENSIVE METABOLIC PANEL
ALBUMIN: 4.3 g/dL (ref 3.5–5.2)
ALT: 21 U/L (ref 0–35)
AST: 19 U/L (ref 0–37)
Alkaline Phosphatase: 65 U/L (ref 39–117)
BUN: 18 mg/dL (ref 6–23)
CALCIUM: 11.4 mg/dL — AB (ref 8.4–10.5)
CHLORIDE: 103 meq/L (ref 96–112)
CO2: 30 mEq/L (ref 19–32)
CREATININE: 0.89 mg/dL (ref 0.40–1.20)
GFR: 81.75 mL/min (ref >60.00)
Glucose, Bld: 118 mg/dL — ABNORMAL HIGH (ref 70–99)
POTASSIUM: 4.2 meq/L (ref 3.5–5.1)
Sodium: 138 mEq/L (ref 135–145)
Total Bilirubin: 0.6 mg/dL (ref 0.2–1.2)
Total Protein: 7.4 g/dL (ref 6.0–8.3)

## 2016-11-08 LAB — LIPID PANEL
Cholesterol: 169 mg/dL (ref 0–200)
HDL: 39.6 mg/dL (ref >39.00)
LDL CALC: 109 mg/dL — AB (ref 0–99)
NonHDL: 129.59
TRIGLYCERIDES: 101 mg/dL (ref 0.0–149.0)
Total CHOL/HDL Ratio: 4
VLDL: 20.2 mg/dL (ref 0.0–40.0)

## 2016-11-08 LAB — HM DIABETES FOOT EXAM

## 2016-11-08 LAB — VITAMIN B12: Vitamin B-12: 215 pg/mL (ref 211–911)

## 2016-11-08 LAB — T4, FREE: Free T4: 0.79 ng/dL (ref 0.60–1.60)

## 2016-11-08 NOTE — Assessment & Plan Note (Signed)
Fairly non specific but along with muscle pain, I am concerned about the hyperparathyroidism Will set up surgical evaluation Check labs--including B12 due to sensory loss

## 2016-11-08 NOTE — Assessment & Plan Note (Signed)
Hopefully still good control Has mild neuropathy as well

## 2016-11-08 NOTE — Progress Notes (Signed)
Subjective:    Patient ID: Sandra [name], female    DOB: 1950-04-08, 66 y.o.   MRN: [id number]  HPI Here for follow up of diabetes and other chronic health conditions  Having more problems with her legs--more pain Wonders about a muscle relaxer Tylenol and other OTC med ---only "tiny" help Some fatigue as well  Sugars are okay Not checking much though Will have weekly low sugar reactions--relates to eating less. Very mild No foot sores or pain. Mild numbness in toes  No chest pain No SOB Feels her heart rate has been faster lately No irregularity though No dizziness or syncope  Current Outpatient Prescriptions on File Prior to Visit  Medication Sig Dispense Refill  . atorvastatin (LIPITOR) 20 MG tablet TAKE 1 TABLET (20 MG TOTAL) BY MOUTH DAILY. 90 tablet 1  . glipiZIDE (GLUCOTROL) 5 MG tablet TAKE 1 TABLET (5 MG TOTAL) BY MOUTH DAILY. 30 tablet 11  . meloxicam (MOBIC) 7.5 MG tablet Take 1 tablet (7.5 mg total) by mouth daily as needed for pain. 30 tablet 0  . metFORMIN (GLUCOPHAGE) 500 MG tablet TAKE 1 TABLET (500 MG TOTAL) BY MOUTH 2 (TWO) TIMES DAILY WITH A MEAL. 180 tablet 3  . metoprolol succinate (TOPROL-XL) 100 MG 24 hr tablet TAKE ONE TABLET BY MOUTH DAILY WITH OR FOLLOWING A MEAL 30 tablet 10  . omeprazole (PRILOSEC) 20 MG capsule TAKE 1 CAPSULE (20 MG TOTAL) BY MOUTH DAILY. 30 capsule 11  . triamterene-hydrochlorothiazide (MAXZIDE-25) 37.5-25 MG tablet TAKE 1 TABLET BY MOUTH DAILY. 90 tablet 3   No current facility-administered medications on file prior to visit.     Allergies  Allergen Reactions  . Lisinopril     REACTION: Angioedema  . Pravastatin Other (See Comments)    Severe myalgias  . Simvastatin Other (See Comments)    myalgias    Past Medical History:  Diagnosis Date  . Arthritis   . Diabetes mellitus without complication ([place])   . GERD (gastroesophageal reflux disease)   . Hyperlipidemia   . Hyperplastic colon polyp   . Hypertension     No  past surgical history on file.  Family History  Problem Relation Age of Onset  . Hypertension Mother   . Diabetes Father   . Hypertension Father   . Diabetes Brother   . Heart disease Brother   . Diabetes Brother   . Diabetes Sister   . Heart disease Daughter   . Cancer Neg Hx        no breast or colon cancer  . Colon cancer Neg Hx   . Esophageal cancer Neg Hx   . Pancreatic cancer Neg Hx   . Rectal cancer Neg Hx   . Stomach cancer Neg Hx     Social History   Social History  . Marital status: Married    Spouse name: N/A  . Number of children: 3  . Years of education: N/A   Occupational History  . clean houses    Social History Main Topics  . Smoking status: Never Smoker  . Smokeless tobacco: Never Used  . Alcohol use No  . Drug use: No  . Sexual activity: Not on file   Other Topics Concern  . Not on file   Social History Narrative  . No narrative on file   Review of Systems Sleep is still not great---increased the melatonin to 10mg . Up at night still Appetite is okay--trying to cut back Has lost a few pounds  Objective:   Physical Exam  Constitutional: She appears well-developed. No distress.  Neck: No thyromegaly present.  Cardiovascular: Normal rate, regular rhythm, normal heart sounds and intact distal pulses.  Exam reveals no gallop.   No murmur heard. occ skips  Pulmonary/Chest: Effort normal and breath sounds normal. No respiratory distress. She has no wheezes. She has no rales.  Musculoskeletal: She exhibits no edema.  Lymphadenopathy:    She has no cervical adenopathy.  Neurological:  Normal sensation in feet  Skin: No rash noted.  No foot lesions  Psychiatric: She has a normal mood and affect. Her behavior is normal.          Assessment & Plan:

## 2016-11-08 NOTE — Assessment & Plan Note (Signed)
Will set up surgical evaluation

## 2016-11-08 NOTE — Assessment & Plan Note (Signed)
BP Readings from Last 3 Encounters:  11/08/16 140/88  05/15/16 (!) 160/84  05/08/16 137/84   Good control now

## 2016-11-17 DIAGNOSIS — E210 Primary hyperparathyroidism: Secondary | ICD-10-CM | POA: Diagnosis not present

## 2016-11-20 ENCOUNTER — Other Ambulatory Visit (HOSPITAL_COMMUNITY): Payer: Self-pay | Admitting: Surgery

## 2016-11-20 ENCOUNTER — Other Ambulatory Visit: Payer: Self-pay | Admitting: Surgery

## 2016-11-20 DIAGNOSIS — E213 Hyperparathyroidism, unspecified: Secondary | ICD-10-CM

## 2016-11-23 DIAGNOSIS — E210 Primary hyperparathyroidism: Secondary | ICD-10-CM | POA: Diagnosis not present

## 2016-11-29 ENCOUNTER — Encounter (HOSPITAL_COMMUNITY)
Admission: RE | Admit: 2016-11-29 | Discharge: 2016-11-29 | Disposition: A | Discharge status: Home / Self Care | Payer: Medicare Other | Source: Ambulatory Visit | Attending: Surgery | Admitting: Surgery

## 2016-11-29 DIAGNOSIS — D351 Benign neoplasm of parathyroid gland: Secondary | ICD-10-CM | POA: Diagnosis not present

## 2016-11-29 DIAGNOSIS — E213 Hyperparathyroidism, unspecified: Secondary | ICD-10-CM | POA: Insufficient documentation

## 2016-11-29 MED ORDER — TECHNETIUM TC 99M SESTAMIBI GENERIC - CARDIOLITE
25.0000 | Freq: Once | INTRAVENOUS | Status: AC | PRN
  Administered 2016-11-29: 25 via INTRAVENOUS

## 2016-12-07 ENCOUNTER — Other Ambulatory Visit: Payer: Self-pay | Admitting: Surgery

## 2016-12-07 DIAGNOSIS — E210 Primary hyperparathyroidism: Secondary | ICD-10-CM

## 2016-12-11 ENCOUNTER — Ambulatory Visit
Admission: RE | Admit: 2016-12-11 | Discharge: 2016-12-11 | Disposition: A | Discharge status: Home / Self Care | Payer: Medicare Other | Source: Ambulatory Visit | Attending: Surgery | Admitting: Surgery

## 2016-12-11 DIAGNOSIS — E210 Primary hyperparathyroidism: Secondary | ICD-10-CM

## 2016-12-11 DIAGNOSIS — E041 Nontoxic single thyroid nodule: Secondary | ICD-10-CM | POA: Diagnosis not present

## 2016-12-15 ENCOUNTER — Ambulatory Visit: Payer: Self-pay | Admitting: Surgery

## 2016-12-18 ENCOUNTER — Ambulatory Visit (INDEPENDENT_AMBULATORY_CARE_PROVIDER_SITE_OTHER): Payer: Medicare Other | Admitting: Family Medicine

## 2016-12-18 ENCOUNTER — Encounter: Payer: Self-pay | Admitting: Family Medicine

## 2016-12-18 ENCOUNTER — Other Ambulatory Visit: Payer: Self-pay | Admitting: Internal Medicine

## 2016-12-18 VITALS — BP 148/80 | HR 56 | Temp 98.3°F | Ht 62.5 in | Wt 179.0 lb

## 2016-12-18 DIAGNOSIS — M7061 Trochanteric bursitis, right hip: Secondary | ICD-10-CM

## 2016-12-18 MED ORDER — METHYLPREDNISOLONE ACETATE 40 MG/ML IJ SUSP
80.0000 mg | Freq: Once | INTRAMUSCULAR | Status: AC
  Administered 2016-12-18: 80 mg via INTRA_ARTICULAR

## 2016-12-18 NOTE — Progress Notes (Signed)
Dr. Frederico Hamman T. Yexalen Deike, MD, [facility] [place], [address] Phone: [phone] Fax: [phone]  12/18/2016  Patient: Sandra [name], MRN: [id number], DOB: 1950/07/04, 66 y.o.  Primary Physician:  Venia Carbon, MD   Chief Complaint  Patient presents with  . Hip Pain    Left-Radiates down lega  . Leg Pain    Right   Subjective:   Sandra [name] is a 66 y.o. very pleasant female patient who presents with the following:  Lateral hip pain and - left is the worst, goes down the leg. And pain down legs for years.  Had GTB on the R before. Walks most days and is strong. No trauma or accident. 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 Some lateral radiation.  L GTB and injection.   Past Medical History, Surgical History, Social History, Family History, Problem List, Medications, and Allergies have been reviewed and updated if relevant.  Patient Active Problem List   Diagnosis Date Noted  . Fatigue 11/08/2016  . Diabetic neuropathy associated with type 2 diabetes mellitus ([place]) 04/25/2016  . GERD (gastroesophageal reflux disease)   . Preventative health care 03/11/2014  . Hand numbness 04/25/2013  . Type 2 diabetes, controlled, with retinopathy ([place])   . Primary hyperparathyroidism ([place]) 02/12/2009  . Anemia 11/27/2007  . Hyperlipemia 10/01/2006  . Essential hypertension, benign 10/01/2006  . MITRAL VALVE PROLAPSE 10/01/2006  . OSTEOARTHRITIS 10/01/2006    Past Medical History:  Diagnosis Date  . Arthritis   . Diabetes mellitus without complication ([place])   . GERD (gastroesophageal reflux disease)   . Hyperlipidemia   . Hyperplastic colon polyp   . Hypertension     No past surgical history on file.  Social History   Social History  . Marital status: Married    Spouse name: N/A  . Number of children: 3  . Years of education: N/A   Occupational History  . clean houses    Social History Main Topics  .  Smoking status: Never Smoker  . Smokeless tobacco: Never Used  . Alcohol use No  . Drug use: No  . Sexual activity: Not on file   Other Topics Concern  . Not on file   Social History Narrative  . No narrative on file    Family History  Problem Relation Age of Onset  . Hypertension Mother   . Diabetes Father   . Hypertension Father   . Diabetes Brother   . Heart disease Brother   . Diabetes Brother   . Diabetes Sister   . Heart disease Daughter   . Cancer Neg Hx        no breast or colon cancer  . Colon cancer Neg Hx   . Esophageal cancer Neg Hx   . Pancreatic cancer Neg Hx   . Rectal cancer Neg Hx   . Stomach cancer Neg Hx     Allergies  Allergen Reactions  . Lisinopril     REACTION: Angioedema  . Pravastatin Other (See Comments)    Severe myalgias  . Simvastatin Other (See Comments)    myalgias    Medication list reviewed and updated in full in [place].  GEN: No fevers, chills. Nontoxic. Primarily MSK c/o today. MSK: Detailed in the HPI GI: tolerating PO intake without difficulty Neuro: No numbness, parasthesias, or tingling associated. Otherwise the pertinent positives of the ROS are noted above.   Objective:   BP (!) 148/80  Pulse (!) 56   Temp 98.3 F (36.8 C) (Oral)   Ht 5' 2.5" (1.588 m)   Wt 179 lb (81.2 kg)   BMI 32.22 kg/m    GEN: WDWN, NAD, Non-toxic, Alert & Oriented x 3 HEENT: Atraumatic, Normocephalic.  Ears and Nose: No external deformity. EXTR: No clubbing/cyanosis/edema NEURO: Normal gait.  PSYCH: Normally interactive. Conversant. Not depressed or anxious appearing.  Calm demeanor.   HIP EXAM: SIDE: L ROM: Abduction, Flexion, Internal and External range of motion: full Pain with terminal IROM and EROM: none GTB: TTP on L SLR: NEG Knees: No effusion FABER: NT REVERSE FABER: NT, neg Piriformis: NT at direct palpation Str: flexion: 5/5 abduction: 5/5 adduction: 5/5 Strength testing non-tender neurovasc intact      Radiology:   Assessment and Plan:   Greater trochanteric bursitis of right hip - Plan: methylPREDNISolone acetate (DEPO-MEDROL) injection 80 mg  I reviewed with the patient the structures involved and how they related to diagnosis.   Patient was given a rehabilitation protocol emphasizing core rehab, partcularly addressing abductor weakness, and stretching of the pelvic musculature, hips, and ITB.  Trochanteric bursa injections have clearly been shown to help with acute bursitis, and the patient would benefit.   Cannot exclude, but less likely back involvement.  Trochanteric Bursitis Injection, L Verbal consent obtained. Risks (including infection, potential atrophy), benefits, and alternatives reviewed. Greater trochanter sterilely prepped with Chloraprep. Ethyl Chloride used for anesthesia. 8 cc of Lidocaine 1% injected with 2 mL of Depo-Medrol 40 mg into trochanteric bursa at area of maximal tenderness at greater trochanter. Needle taken to bone to troch bursa, flows easily. Bursa massaged. No bleeding and no complications. Decreased pain after injection. Needle: 22 gauge spinal needle   Follow-up: if not better in 4-6 weeks  Future Appointments Date Time Provider [place]  05/22/2017 9:30 AM Venia Carbon, MD [id number]    Meds ordered this encounter  Medications  . methylPREDNISolone acetate (DEPO-MEDROL) injection 80 mg   Signed,  Iman Reinertsen T. Aleksa Collinsworth, MD   Patient's Medications  New Prescriptions   No medications on file  Previous Medications   ATORVASTATIN (LIPITOR) 20 MG TABLET    TAKE 1 TABLET (20 MG TOTAL) BY MOUTH DAILY.   GLIPIZIDE (GLUCOTROL) 5 MG TABLET    TAKE 1 TABLET (5 MG TOTAL) BY MOUTH DAILY.   MELOXICAM (MOBIC) 7.5 MG TABLET    Take 1 tablet (7.5 mg total) by mouth daily as needed for pain.   METFORMIN (GLUCOPHAGE) 500 MG TABLET    TAKE 1 TABLET (500 MG TOTAL) BY MOUTH 2 (TWO) TIMES DAILY WITH A MEAL.   METOPROLOL SUCCINATE  (TOPROL-XL) 100 MG 24 HR TABLET    TAKE ONE TABLET BY MOUTH DAILY WITH OR FOLLOWING A MEAL   OMEPRAZOLE (PRILOSEC) 20 MG CAPSULE    TAKE 1 CAPSULE (20 MG TOTAL) BY MOUTH DAILY.   TRIAMTERENE-HYDROCHLOROTHIAZIDE (MAXZIDE-25) 37.5-25 MG TABLET    TAKE 1 TABLET BY MOUTH DAILY.  Modified Medications   No medications on file  Discontinued Medications   No medications on file

## 2016-12-25 ENCOUNTER — Encounter: Payer: Self-pay | Admitting: Surgery

## 2016-12-25 NOTE — H&P (Addendum)
General Surgery [facility]  Sandra [name] DOB: [birth year] Married / Language: English / Race: Black or African American Female   History of Present Illness  Patient words: hyperparathyroid.  The patient is a 66 year old female who presents with primary hyperparathyroidism.  CC: primary hyperparathyroidism  Patient is referred by Dr. Viviana Simpler for evaluation of suspected primary hyperparathyroidism. patient has a long-standing history of hypercalcemia ranging from 10.5-11.3 and a long-standing history dating back 7 years of elevated intact PTH levels ranging from 113-145. Patient complains of lower extremity discomfort and cramping, mainly involving the hips and the knees. She does have a history of nephrolithiasis. She complains of chronic fatigue. Patient denies any head or neck surgery. She has never been on thyroid medication. She does have a son who was treated for Graves' disease with radioactive iodine and takes thyroid medication. There is no other family history of endocrinopathy. Patient denies any recent fractures. She denies tremors or palpitations. She has had no other diagnostic studies. She does take vitamin D supplement. She has not had a bone density scan but she has discussed the study with her primary care physician.   Problem List/Past Medical Arthritis  Back Pain  Heart murmur  High blood pressure  Thyroid Disease  Diabetes  Chest pain  High Cholesterol  Kidney Stone   Past Surgical History  Colon Polyp Removal - Colonoscopy   Diagnostic Studies History Colonoscopy   Allergies  Lisinopril *ANTIHYPERTENSIVES*  Pravastatin Sodium *ANTIHYPERLIPIDEMICS*  Simvastatin *CHEMICALS*   Medication History Atorvastatin Calcium (20MG  Tablet, Oral) Active. GlipiZIDE (5MG  Tablet, Oral) Active. Meloxicam (7.5MG  Tablet, Oral) Active. Metoprolol Succinate ER (100MG  Tablet ER 24HR, Oral) Active. Omeprazole (20MG   Capsule DR, Oral) Active. Triamterene-HCTZ (37.5-25MG  Capsule, Oral) Active. Medications Reconciled  Social History No alcohol use  No caffeine use  No drug use   Family History  Arthritis  Mother. Diabetes Mellitus  Father, Sister, Brother, Daughter. Heart Disease  Father, Brother, Daughter. High Blood Pressure / Hypertension  Mother, Brother, Sister, Daughter. Stroke / CVA of the Brain  Brother. Thyroid problems  Mother, Tora Duck, Pandora Leiter.  Pregnancy / Birth History Age at menarche  41 years. Irregular periods  Age of menopause  50-50. Pregnancies ([place])  3. Deliveries ([place])  3. Contraceptive History  Mammogram  1-3 years ago.    Review of Systems  HEENT Present- Wears glasses/contact lenses. Cardiovascular Present- Leg Cramps, Rapid Heart Rate, Shortness of Breath and Swelling of Extremities. Musculoskeletal Present- Back Pain, Joint Pain, Muscle Pain, Muscle Weakness and Swelling of Extremities. Neurological Present- Numbness, Trouble walking and Weakness. Endocrine Present- Hot flashes.  Vitals Weight: 180.38 lb Height: 63in Body Surface Area: 1.85 m Body Mass Index: 31.95 kg/m  Temp.: 98.39F(Oral)  Pulse: 63 (Regular)  BP: 124/80 (Sitting, Left Arm, Standard)  Physical Exam  See vital signs recorded above  GENERAL APPEARANCE Development: normal Nutritional status: normal Gross deformities: none  SKIN Rash, lesions, ulcers: none Induration, erythema: none Nodules: none palpable  EYES Conjunctiva and lids: normal Pupils: equal and reactive Iris: normal bilaterally  EARS, NOSE, MOUTH, THROAT External ears: no lesion or deformity External nose: no lesion or deformity Hearing: grossly normal Lips: no lesion or deformity Dentition: normal for age Oral mucosa: moist  NECK Symmetric: yes Trachea: midline Thyroid: no palpable nodules in the thyroid bed  CHEST Respiratory effort: normal Retraction or accessory  muscle use: no Breath sounds: normal bilaterally Rales, rhonchi, wheeze: none  CARDIOVASCULAR Auscultation: regular rhythm, normal rate Murmurs: none  Pulses: carotid and radial pulse 2+ palpable Lower extremity edema: none Lower extremity varicosities: none  MUSCULOSKELETAL Station and gait: normal Digits and nails: no clubbing or cyanosis Muscle strength: grossly normal all extremities Range of motion: grossly normal all extremities Deformity: none  LYMPHATIC Cervical: none palpable Supraclavicular: none palpable  PSYCHIATRIC Oriented to person, place, and time: yes Mood and affect: normal for situation Judgment and insight: appropriate for situation    Assessment & Plan  PRIMARY HYPERPARATHYROIDISM (E21.0)  Patient is referred by her primary care physician for evaluation of suspected primary hyperparathyroidism. Patient is provided with written literature on parathyroid disease to review at home.  Patient has had a long-standing history of hypercalcemia and elevated intact PTH level. She does appear to be symptomatic. I have recommended proceeding with further diagnostic studies including a 24-hour urine collection for calcium and a nuclear medicine parathyroid scan. We will order both of these studies today. I will contact the patient with those results when they are available.  Patient amd I discussed minimally invasive parathyroid surgery. We will discuss this further once the results of her studies are available for review. I believe she will be a good candidate for outpatient surgery.  Pt Education - Pamphlet Given - The Parathyroid Surgery Book: discussed with patient and provided information.  ADDENDUM: Nuclear medicine parathyroid scan and USN of neck correlate with left inferior parathyroid adenoma.  Will plan minimally invasive surgery.  The risks and benefits of the procedure have been discussed at length with the patient.  The patient understands the  proposed procedure, potential alternative treatments, and the course of recovery to be expected.  All of the patient's questions have been answered at this time.  The patient wishes to proceed with surgery.  Armandina Gemma, [facility] Office: [phone]

## 2016-12-26 ENCOUNTER — Other Ambulatory Visit (HOSPITAL_COMMUNITY): Payer: Self-pay | Admitting: *Deleted

## 2016-12-26 NOTE — Pre-Procedure Instructions (Signed)
Sandra [name]  12/26/2016    Your procedure is scheduled on Friday, December 29, 2016 at at 9:30 AM.   Report to [facility] Entrance "A" Admitting Office at 7:30 AM.   Call this number if you have problems the morning of surgery: [phone]   Questions prior to day of surgery, please call [phone] between 8 & 4 PM.   Remember:  Do not eat food or drink liquids after midnight Thursday, 12/28/16.  Take these medicines the morning of surgery with A SIP OF WATER: Metoprolol (Toprol XL), Omeprazole (Prilosec)  Do not take your diabetic pills (Glipizide and Metformin) the morning of surgery.  Stop NSAIDS (Ibuprofen, Mobic, Aleve, etc) as of today. Do not use Aspirin products prior to surgery.   How to Manage Your Diabetes Before Surgery   Why is it important to control my blood sugar before and after surgery?   Improving blood sugar levels before and after surgery helps healing and can limit problems.  A way of improving blood sugar control is eating a healthy diet by:  - Eating less sugar and carbohydrates  - Increasing activity/exercise  - Talk with your doctor about reaching your blood sugar goals  High blood sugars (greater than 180 mg/dL) can raise your risk of infections and slow down your recovery so you will need to focus on controlling your diabetes during the weeks before surgery.  Make sure that the doctor who takes care of your diabetes knows about your planned surgery including the date and location.  How do I manage my blood sugars before surgery?   Check your blood sugar at least 4 times a day, 2 days before surgery to make sure that they are not too high or low.  Check your blood sugar the morning of your surgery when you wake up and every 2 hours until you get to the Short-Stay unit.  Treat a low blood sugar (less than 70 mg/dL) with 1/2 cup of clear juice (cranberry or apple), 4 glucose tablets, OR glucose gel.  Recheck blood sugar in 15  minutes after treatment (to make sure it is greater than 70 mg/dL).  If blood sugar is not greater than 70 mg/dL on re-check, call [phone] for further instructions.   Report your blood sugar to the Short-Stay nurse when you get to Short-Stay.  References:  [facility], 2007 "How to Manage your Diabetes Before and After Surgery".   Do not wear jewelry, make-up or nail polish.  Do not wear lotions, powders, perfumes or deodorant.  Do not shave 48 hours prior to surgery.    Do not bring valuables to the hospital.  [facility] is not responsible for any belongings or valuables.  Contacts, dentures or bridgework may not be worn into surgery.  Leave your suitcase in the car.  After surgery it may be brought to your room.  For patients admitted to the hospital, discharge time will be determined by your treatment team.  [facility] - Preparing for Surgery  Before surgery, you can play an important role.  Because skin is not sterile, your skin needs to be as free of germs as possible.  You can reduce the number of germs on you skin by washing with CHG (chlorahexidine gluconate) soap before surgery.  CHG is an antiseptic cleaner which kills germs and bonds with the skin to continue killing germs even after washing.  Please DO NOT use if you have an allergy to CHG  or antibacterial soaps.  If your skin becomes reddened/irritated stop using the CHG and inform your nurse when you arrive at Short Stay.  Do not shave (including legs and underarms) for at least 48 hours prior to the first CHG shower.  You may shave your face.  Please follow these instructions carefully:   1.  Shower with CHG Soap the night before surgery and the                    morning of Surgery.  2.  If you choose to wash your hair, wash your hair first as usual with your       normal shampoo.  3.  After you shampoo, rinse your hair and body thoroughly to remove the shampoo.  4.  Use CHG as you would  any other liquid soap.  You can apply chg directly       to the skin and wash gently with scrungie or a clean washcloth.  5.  Apply the CHG Soap to your body ONLY FROM THE NECK DOWN.        Do not use on open wounds or open sores.  Avoid contact with your eyes, ears, mouth and genitals (private parts).  Wash genitals (private parts) with your normal soap.  6.  Wash thoroughly, paying special attention to the area where your surgery        will be performed.  7.  Thoroughly rinse your body with warm water from the neck down.  8.  DO NOT shower/wash with your normal soap after using and rinsing off       the CHG Soap.  9.  Pat yourself dry with a clean towel.            10.  Wear clean pajamas.            11.  Place clean sheets on your bed the night of your first shower and do not        sleep with pets.  Day of Surgery  Do not apply any lotions/deodorants the morning of surgery.  Please wear clean clothes to the hospital.   Please read over the fact sheets that you were given.

## 2016-12-27 ENCOUNTER — Encounter (HOSPITAL_COMMUNITY)
Admission: RE | Admit: 2016-12-27 | Discharge: 2016-12-27 | Disposition: A | Discharge status: Home / Self Care | Payer: Medicare Other | Source: Ambulatory Visit | Attending: Surgery | Admitting: Surgery

## 2016-12-27 ENCOUNTER — Encounter (HOSPITAL_COMMUNITY): Payer: Self-pay

## 2016-12-27 DIAGNOSIS — E210 Primary hyperparathyroidism: Secondary | ICD-10-CM | POA: Diagnosis not present

## 2016-12-27 DIAGNOSIS — Z79899 Other long term (current) drug therapy: Secondary | ICD-10-CM | POA: Diagnosis not present

## 2016-12-27 DIAGNOSIS — E119 Type 2 diabetes mellitus without complications: Secondary | ICD-10-CM | POA: Diagnosis not present

## 2016-12-27 DIAGNOSIS — Z791 Long term (current) use of non-steroidal anti-inflammatories (NSAID): Secondary | ICD-10-CM | POA: Diagnosis not present

## 2016-12-27 DIAGNOSIS — E214 Other specified disorders of parathyroid gland: Secondary | ICD-10-CM | POA: Diagnosis not present

## 2016-12-27 DIAGNOSIS — Z8349 Family history of other endocrine, nutritional and metabolic diseases: Secondary | ICD-10-CM | POA: Diagnosis not present

## 2016-12-27 DIAGNOSIS — E7800 Pure hypercholesterolemia, unspecified: Secondary | ICD-10-CM | POA: Diagnosis not present

## 2016-12-27 DIAGNOSIS — Z7984 Long term (current) use of oral hypoglycemic drugs: Secondary | ICD-10-CM | POA: Diagnosis not present

## 2016-12-27 DIAGNOSIS — Z87442 Personal history of urinary calculi: Secondary | ICD-10-CM | POA: Diagnosis not present

## 2016-12-27 DIAGNOSIS — I10 Essential (primary) hypertension: Secondary | ICD-10-CM | POA: Diagnosis not present

## 2016-12-27 HISTORY — DX: Personal history of urinary calculi: Z87.442

## 2016-12-27 HISTORY — DX: Headache, unspecified: R51.9

## 2016-12-27 HISTORY — DX: Headache: R51

## 2016-12-27 HISTORY — DX: Cardiac murmur, unspecified: R01.1

## 2016-12-27 HISTORY — DX: Cardiac arrhythmia, unspecified: I49.9

## 2016-12-27 HISTORY — DX: Anemia, unspecified: D64.9

## 2016-12-27 HISTORY — DX: Family history of other specified conditions: Z84.89

## 2016-12-27 HISTORY — DX: Hyperparathyroidism, unspecified: E21.3

## 2016-12-27 HISTORY — DX: Restless legs syndrome: G25.81

## 2016-12-27 HISTORY — DX: Heart failure, unspecified: I50.9

## 2016-12-27 LAB — BASIC METABOLIC PANEL
ANION GAP: 6 (ref 5–15)
BUN: 16 mg/dL (ref 6–20)
CHLORIDE: 103 mmol/L (ref 101–111)
CO2: 27 mmol/L (ref 22–32)
Calcium: 10.7 mg/dL — ABNORMAL HIGH (ref 8.9–10.3)
Creatinine, Ser: 0.81 mg/dL (ref 0.44–1.00)
GFR calc Af Amer: >60 mL/min (ref >60)
GFR calc non Af Amer: >60 mL/min (ref >60)
GLUCOSE: 118 mg/dL — AB (ref 65–99)
POTASSIUM: 3.7 mmol/L (ref 3.5–5.1)
Sodium: 136 mmol/L (ref 135–145)

## 2016-12-27 LAB — CBC
HEMATOCRIT: 35 % — AB (ref 36.0–46.0)
HEMOGLOBIN: 11.1 g/dL — AB (ref 12.0–15.0)
MCH: 28.8 pg (ref 26.0–34.0)
MCHC: 31.7 g/dL (ref 30.0–36.0)
MCV: 90.9 fL (ref 78.0–100.0)
Platelets: 266 10*3/uL (ref 150–400)
RBC: 3.85 MIL/uL — ABNORMAL LOW (ref 3.87–5.11)
RDW: 14.5 % (ref 11.5–15.5)
WBC: 5.5 10*3/uL (ref 4.0–10.5)

## 2016-12-27 LAB — GLUCOSE, CAPILLARY: Glucose-Capillary: 83 mg/dL (ref 65–99)

## 2016-12-27 NOTE — Progress Notes (Signed)
Pt states Dr. Silvio Pate treats her for CHF. Denies any other heart problems. Pt is a type 2 Diabetic. Last A1C was 7.1 on 11/08/16. Pt states she does not check her blood sugar at home.

## 2016-12-29 ENCOUNTER — Ambulatory Visit (HOSPITAL_COMMUNITY): Payer: Medicare Other | Admitting: Certified Registered"

## 2016-12-29 ENCOUNTER — Ambulatory Visit (HOSPITAL_COMMUNITY)
Admission: RE | Admit: 2016-12-29 | Discharge: 2016-12-29 | Disposition: A | Discharge status: Home / Self Care | Payer: Medicare Other | Source: Ambulatory Visit | Attending: Surgery | Admitting: Surgery

## 2016-12-29 ENCOUNTER — Encounter (HOSPITAL_COMMUNITY): Payer: Self-pay | Admitting: *Deleted

## 2016-12-29 ENCOUNTER — Encounter (HOSPITAL_COMMUNITY)
Admission: RE | Disposition: A | Discharge status: Home / Self Care | Payer: Self-pay | Source: Ambulatory Visit | Attending: Surgery

## 2016-12-29 DIAGNOSIS — E210 Primary hyperparathyroidism: Secondary | ICD-10-CM | POA: Diagnosis not present

## 2016-12-29 DIAGNOSIS — E7800 Pure hypercholesterolemia, unspecified: Secondary | ICD-10-CM | POA: Diagnosis not present

## 2016-12-29 DIAGNOSIS — I10 Essential (primary) hypertension: Secondary | ICD-10-CM | POA: Insufficient documentation

## 2016-12-29 DIAGNOSIS — Z7984 Long term (current) use of oral hypoglycemic drugs: Secondary | ICD-10-CM | POA: Insufficient documentation

## 2016-12-29 DIAGNOSIS — Z87442 Personal history of urinary calculi: Secondary | ICD-10-CM | POA: Insufficient documentation

## 2016-12-29 DIAGNOSIS — Z79899 Other long term (current) drug therapy: Secondary | ICD-10-CM | POA: Insufficient documentation

## 2016-12-29 DIAGNOSIS — Z8349 Family history of other endocrine, nutritional and metabolic diseases: Secondary | ICD-10-CM | POA: Insufficient documentation

## 2016-12-29 DIAGNOSIS — Z791 Long term (current) use of non-steroidal anti-inflammatories (NSAID): Secondary | ICD-10-CM | POA: Diagnosis not present

## 2016-12-29 DIAGNOSIS — E785 Hyperlipidemia, unspecified: Secondary | ICD-10-CM | POA: Diagnosis not present

## 2016-12-29 DIAGNOSIS — D351 Benign neoplasm of parathyroid gland: Secondary | ICD-10-CM | POA: Diagnosis not present

## 2016-12-29 DIAGNOSIS — E119 Type 2 diabetes mellitus without complications: Secondary | ICD-10-CM | POA: Diagnosis not present

## 2016-12-29 DIAGNOSIS — E214 Other specified disorders of parathyroid gland: Secondary | ICD-10-CM | POA: Insufficient documentation

## 2016-12-29 HISTORY — PX: PARATHYROIDECTOMY: SHX19

## 2016-12-29 LAB — GLUCOSE, CAPILLARY
Glucose-Capillary: 110 mg/dL — ABNORMAL HIGH (ref 65–99)
Glucose-Capillary: 153 mg/dL — ABNORMAL HIGH (ref 65–99)

## 2016-12-29 SURGERY — PARATHYROIDECTOMY
Anesthesia: General | Site: Neck | Laterality: Left

## 2016-12-29 MED ORDER — LIDOCAINE 2% (20 MG/ML) 5 ML SYRINGE
INTRAMUSCULAR | Status: AC
  Filled 2016-12-29: qty 10

## 2016-12-29 MED ORDER — 0.9 % SODIUM CHLORIDE (POUR BTL) OPTIME
TOPICAL | Status: DC | PRN
  Administered 2016-12-29: 1000 mL

## 2016-12-29 MED ORDER — BUPIVACAINE HCL (PF) 0.25 % IJ SOLN
INTRAMUSCULAR | Status: DC | PRN
Start: 2016-12-29 — End: 2016-12-29
  Administered 2016-12-29: 10 mL

## 2016-12-29 MED ORDER — ONDANSETRON HCL 4 MG/2ML IJ SOLN
INTRAMUSCULAR | Status: AC
  Filled 2016-12-29: qty 4

## 2016-12-29 MED ORDER — HYDROMORPHONE HCL 1 MG/ML IJ SOLN: 0.2500 mg | INTRAMUSCULAR | Status: DC | PRN

## 2016-12-29 MED ORDER — PHENYLEPHRINE 40 MCG/ML (10ML) SYRINGE FOR IV PUSH (FOR BLOOD PRESSURE SUPPORT)
PREFILLED_SYRINGE | INTRAVENOUS | Status: AC
  Filled 2016-12-29: qty 10

## 2016-12-29 MED ORDER — HYDROCODONE-ACETAMINOPHEN 5-325 MG PO TABS: 1.0000 | ORAL_TABLET | ORAL | 0 refills | Status: DC | PRN

## 2016-12-29 MED ORDER — ROCURONIUM BROMIDE 10 MG/ML (PF) SYRINGE
PREFILLED_SYRINGE | INTRAVENOUS | Status: AC
  Filled 2016-12-29: qty 10

## 2016-12-29 MED ORDER — ONDANSETRON HCL 4 MG/2ML IJ SOLN
INTRAMUSCULAR | Status: DC | PRN
  Administered 2016-12-29: 4 mg via INTRAVENOUS

## 2016-12-29 MED ORDER — LABETALOL HCL 5 MG/ML IV SOLN
INTRAVENOUS | Status: DC | PRN
  Administered 2016-12-29 (×4): 5 mg via INTRAVENOUS

## 2016-12-29 MED ORDER — PROPOFOL 10 MG/ML IV BOLUS
INTRAVENOUS | Status: AC
  Filled 2016-12-29: qty 20

## 2016-12-29 MED ORDER — EPHEDRINE 5 MG/ML INJ
INTRAVENOUS | Status: AC
  Filled 2016-12-29: qty 10

## 2016-12-29 MED ORDER — LACTATED RINGERS IV SOLN
INTRAVENOUS | Status: DC
  Administered 2016-12-29 (×2): via INTRAVENOUS

## 2016-12-29 MED ORDER — OXYCODONE HCL 5 MG/5ML PO SOLN: 5.0000 mg | Freq: Once | ORAL | Status: DC | PRN

## 2016-12-29 MED ORDER — PROMETHAZINE HCL 25 MG/ML IJ SOLN: 6.2500 mg | INTRAMUSCULAR | Status: DC | PRN

## 2016-12-29 MED ORDER — CHLORHEXIDINE GLUCONATE CLOTH 2 % EX PADS: 6.0000 | MEDICATED_PAD | Freq: Once | CUTANEOUS | Status: DC

## 2016-12-29 MED ORDER — PROPOFOL 10 MG/ML IV BOLUS
INTRAVENOUS | Status: DC | PRN
  Administered 2016-12-29: 150 mg via INTRAVENOUS

## 2016-12-29 MED ORDER — CEFAZOLIN SODIUM-DEXTROSE 2-4 GM/100ML-% IV SOLN
2.0000 g | INTRAVENOUS | Status: AC
  Administered 2016-12-29: 2 g via INTRAVENOUS
  Filled 2016-12-29: qty 100

## 2016-12-29 MED ORDER — SUGAMMADEX SODIUM 200 MG/2ML IV SOLN
INTRAVENOUS | Status: DC | PRN
  Administered 2016-12-29: 200 mg via INTRAVENOUS

## 2016-12-29 MED ORDER — BUPIVACAINE HCL (PF) 0.25 % IJ SOLN
INTRAMUSCULAR | Status: AC
  Filled 2016-12-29: qty 30

## 2016-12-29 MED ORDER — LIDOCAINE HCL (CARDIAC) 20 MG/ML IV SOLN
INTRAVENOUS | Status: DC | PRN
  Administered 2016-12-29: 80 mg via INTRAVENOUS

## 2016-12-29 MED ORDER — OXYCODONE HCL 5 MG PO TABS: 5.0000 mg | ORAL_TABLET | Freq: Once | ORAL | Status: DC | PRN

## 2016-12-29 MED ORDER — FENTANYL CITRATE (PF) 250 MCG/5ML IJ SOLN
INTRAMUSCULAR | Status: AC
  Filled 2016-12-29: qty 5

## 2016-12-29 MED ORDER — FENTANYL CITRATE (PF) 100 MCG/2ML IJ SOLN
INTRAMUSCULAR | Status: DC | PRN
  Administered 2016-12-29: 50 ug via INTRAVENOUS
  Administered 2016-12-29: 100 ug via INTRAVENOUS
  Administered 2016-12-29: 50 ug via INTRAVENOUS

## 2016-12-29 MED ORDER — HEMOSTATIC AGENTS (NO CHARGE) OPTIME
TOPICAL | Status: DC | PRN
  Administered 2016-12-29: 1 via TOPICAL

## 2016-12-29 MED ORDER — MEPERIDINE HCL 25 MG/ML IJ SOLN: 6.2500 mg | INTRAMUSCULAR | Status: DC | PRN

## 2016-12-29 MED ORDER — SUCCINYLCHOLINE CHLORIDE 200 MG/10ML IV SOSY
PREFILLED_SYRINGE | INTRAVENOUS | Status: AC
  Filled 2016-12-29: qty 10

## 2016-12-29 MED ORDER — ROCURONIUM BROMIDE 100 MG/10ML IV SOLN
INTRAVENOUS | Status: DC | PRN
  Administered 2016-12-29: 50 mg via INTRAVENOUS

## 2016-12-29 SURGICAL SUPPLY — 49 items
ATTRACTOMAT 16X20 MAGNETIC DRP (DRAPES) ×3 IMPLANT
BLADE SURG 15 STRL LF DISP TIS (BLADE) ×1 IMPLANT
BLADE SURG 15 STRL SS (BLADE) ×3
CANISTER SUCT 3000ML PPV (MISCELLANEOUS) ×3 IMPLANT
CHLORAPREP W/TINT 26ML (MISCELLANEOUS) ×3 IMPLANT
CLIP VESOCCLUDE MED 6/CT (CLIP) ×3 IMPLANT
CLIP VESOCCLUDE SM WIDE 6/CT (CLIP) ×3 IMPLANT
CLOSURE WOUND 1/2 X4 (GAUZE/BANDAGES/DRESSINGS) ×1
CONT SPEC 4OZ CLIKSEAL STRL BL (MISCELLANEOUS) ×3 IMPLANT
COVER SURGICAL LIGHT HANDLE (MISCELLANEOUS) ×3 IMPLANT
CRADLE DONUT ADULT HEAD (MISCELLANEOUS) ×3 IMPLANT
DRAPE LAPAROTOMY 100X72 PEDS (DRAPES) ×3 IMPLANT
DRAPE UTILITY XL STRL (DRAPES) ×3 IMPLANT
ELECT CAUTERY BLADE 6.4 (BLADE) ×3 IMPLANT
ELECT REM PT RETURN 9FT ADLT (ELECTROSURGICAL) ×3
ELECTRODE REM PT RTRN 9FT ADLT (ELECTROSURGICAL) ×1 IMPLANT
GAUZE SPONGE 2X2 8PLY STRL LF (GAUZE/BANDAGES/DRESSINGS) ×1 IMPLANT
GAUZE SPONGE 4X4 16PLY XRAY LF (GAUZE/BANDAGES/DRESSINGS) ×3 IMPLANT
GLOVE SURG ORTHO 8.0 STRL STRW (GLOVE) ×3 IMPLANT
GOWN STRL REUS W/ TWL LRG LVL3 (GOWN DISPOSABLE) ×1 IMPLANT
GOWN STRL REUS W/ TWL XL LVL3 (GOWN DISPOSABLE) ×1 IMPLANT
GOWN STRL REUS W/TWL LRG LVL3 (GOWN DISPOSABLE) ×3
GOWN STRL REUS W/TWL XL LVL3 (GOWN DISPOSABLE) ×3
HEMOSTAT SURGICEL 2X4 FIBR (HEMOSTASIS) ×3 IMPLANT
ILLUMINATOR WAVEGUIDE N/F (MISCELLANEOUS) ×3 IMPLANT
KIT BASIN OR (CUSTOM PROCEDURE TRAY) ×3 IMPLANT
KIT ROOM TURNOVER OR (KITS) ×3 IMPLANT
NEEDLE HYPO 25GX1X1/2 BEV (NEEDLE) ×3 IMPLANT
NS IRRIG 1000ML POUR BTL (IV SOLUTION) ×3 IMPLANT
PACK SURGICAL SETUP 50X90 (CUSTOM PROCEDURE TRAY) ×3 IMPLANT
PAD ARMBOARD 7.5X6 YLW CONV (MISCELLANEOUS) ×3 IMPLANT
PENCIL BUTTON HOLSTER BLD 10FT (ELECTRODE) ×3 IMPLANT
SPONGE GAUZE 2X2 STER 10/PKG (GAUZE/BANDAGES/DRESSINGS) ×2
SPONGE INTESTINAL PEANUT (DISPOSABLE) IMPLANT
STRIP CLOSURE SKIN 1/2X4 (GAUZE/BANDAGES/DRESSINGS) ×2 IMPLANT
SUT MNCRL AB 4-0 PS2 18 (SUTURE) ×3 IMPLANT
SUT SILK 2 0 (SUTURE)
SUT SILK 2-0 18XBRD TIE 12 (SUTURE) IMPLANT
SUT SILK 3 0 (SUTURE)
SUT SILK 3-0 18XBRD TIE 12 (SUTURE) IMPLANT
SUT VIC AB 3-0 SH 18 (SUTURE) ×3 IMPLANT
SUT VIC AB 3-0 SH 27 (SUTURE) ×3
SUT VIC AB 3-0 SH 27X BRD (SUTURE) ×1 IMPLANT
SYR BULB 3OZ (MISCELLANEOUS) ×3 IMPLANT
SYR CONTROL 10ML LL (SYRINGE) ×3 IMPLANT
TOWEL OR 17X24 6PK STRL BLUE (TOWEL DISPOSABLE) ×3 IMPLANT
TOWEL OR 17X26 10 PK STRL BLUE (TOWEL DISPOSABLE) ×3 IMPLANT
TUBE CONNECTING 12'X1/4 (SUCTIONS) ×1
TUBE CONNECTING 12X1/4 (SUCTIONS) ×2 IMPLANT

## 2016-12-29 NOTE — Anesthesia Postprocedure Evaluation (Signed)
Anesthesia Post Note  Patient: Sandra [name]  Procedure(s) Performed: LEFT INFERIOR PARATHYROIDECTOMY (Left Neck)     Patient location during evaluation: PACU Anesthesia Type: General Level of consciousness: awake and alert Pain management: pain level controlled Vital Signs Assessment: post-procedure vital signs reviewed and stable Respiratory status: spontaneous breathing, nonlabored ventilation and respiratory function stable Cardiovascular status: blood pressure returned to baseline and stable Postop Assessment: no apparent nausea or vomiting Anesthetic complications: no    Last Vitals:  Vitals:   12/29/16 1215 12/29/16 1230  BP: (!) 156/78   Pulse: 71 (!) 59  Resp: 10 19  Temp:    SpO2: 98% 97%    Last Pain:  Vitals:   12/29/16 0753  TempSrc: Oral                 Lynda Rainwater

## 2016-12-29 NOTE — Anesthesia Procedure Notes (Signed)
Procedure Name: Intubation Date/Time: 12/29/2016 10:06 AM Performed by: Manuela Schwartz B Pre-anesthesia Checklist: Patient identified, Emergency Drugs available, Suction available and Patient being monitored Patient Re-evaluated:Patient Re-evaluated prior to induction Oxygen Delivery Method: Circle System Utilized Preoxygenation: Pre-oxygenation with 100% oxygen Induction Type: IV induction Ventilation: Mask ventilation without difficulty Laryngoscope Size: Mac and 3 Grade View: Grade I Tube type: Oral Tube size: 7.0 mm Number of attempts: 1 Airway Equipment and Method: Stylet and Oral airway Placement Confirmation: ETT inserted through vocal cords under direct vision,  positive ETCO2 and breath sounds checked- equal and bilateral Secured at: 21 cm Tube secured with: Tape Dental Injury: Teeth and Oropharynx as per pre-operative assessment

## 2016-12-29 NOTE — Op Note (Signed)
OPERATIVE REPORT - PARATHYROIDECTOMY  Preoperative diagnosis: Primary hyperparathyroidism  Postop diagnosis: Same  Procedure: Left inferior minimally invasive parathyroidectomy  Surgeon:  Armandina Gemma, MD  Anesthesia: General endotracheal  Estimated blood loss: Minimal  Preparation: ChloraPrep  Indications: Patient is referred by Dr. Viviana Simpler for evaluation of suspected primary hyperparathyroidism. patient has a long-standing history of hypercalcemia ranging from 10.5-11.3 and a long-standing history dating back 7 years of elevated intact PTH levels ranging from 113-145. Patient complains of lower extremity discomfort and cramping, mainly involving the hips and the knees. She does have a history of nephrolithiasis. She complains of chronic fatigue. Patient denies any head or neck surgery. She has never been on thyroid medication. She does have a son who was treated for Graves' disease with radioactive iodine and takes thyroid medication. There is no other family history of endocrinopathy. Patient denies any recent fractures. She denies tremors or palpitations. Nuclear med parathyroid scan and USN localized an adenoma to the left inferior position.  Patient now comes to surgery for parathyroidectomy.  Procedure: The patient was prepared in the pre-operative holding area. The patient was brought to the operating room and placed in a supine position on the operating room table. Following administration of general anesthesia, the patient was positioned and then prepped and draped in the usual strict aseptic fashion. After ascertaining that an adequate level of anesthesia been achieved, a neck incision was made with a #15 blade. Dissection was carried through subcutaneous tissues and platysma. Hemostasis was obtained with the electrocautery. Skin flaps were developed circumferentially and a Weitlander retractor was placed for exposure.  Strap muscles were incised in the midline. Strap  muscles were reflected lateralley exposing the thyroid lobe. With gentle blunt dissection the thyroid lobe was mobilized.  Dissection was carried through adipose tissue and an enlarged parathyroid gland was identified in the upper thyrothymic tract. It was gently mobilized. Vascular structures were divided between small and medium ligaclips. Care was taken to avoid the recurrent laryngeal nerve and the esophagus. The parathyroid gland was completely excised. It was submitted to pathology where frozen section confirmed parathyroid tissue consistent with adenoma.  Neck was irrigated with warm saline and good hemostasis was noted. Fibrillar was placed in the operative field. Strap muscles were reapproximated in the midline with interrupted 3-0 Vicryl sutures. Platysma was closed with interrupted 3-0 Vicryl sutures. Skin was closed with a running 4-0 Monocryl subcuticular suture. Marcaine was infiltrated circumferentially. Wound was washed and dried and Steristrips were applied. Patient was awakened from anesthesia and brought to the recovery room. The patient tolerated the procedure well.   Armandina Gemma, MD [facility] Office: [phone]

## 2016-12-29 NOTE — Interval H&P Note (Signed)
History and Physical Interval Note:  12/29/2016 9:15 AM  Sandra [name]  has presented today for surgery, with the diagnosis of PRIMARY HYPERPARATHYROIDISM   The various methods of treatment have been discussed with the patient and family. After consideration of risks, benefits and other options for treatment, the patient has consented to    Procedure(s): LEFT INFERIOR PARATHYROIDECTOMY (Left) as a surgical intervention .    The patient's history has been reviewed, patient examined, no change in status, stable for surgery.  I have reviewed the patient's chart and labs.  Questions were answered to the patient's satisfaction.    Armandina Gemma, [facility] Office: [place]

## 2016-12-29 NOTE — Transfer of Care (Signed)
Immediate Anesthesia Transfer of Care Note  Patient: Sandra [name]  Procedure(s) Performed: LEFT INFERIOR PARATHYROIDECTOMY (Left Neck)  Patient Location: PACU  Anesthesia Type:General  Level of Consciousness: awake and alert   Airway & Oxygen Therapy: Patient Spontanous Breathing  Post-op Assessment: Report given to RN and Post -op Vital signs reviewed and stable  Post vital signs: Reviewed and stable  Last Vitals:  Vitals:   12/29/16 0753 12/29/16 1126  BP: (!) 193/71 (!) 162/75  Pulse: (!) 53 87  Resp: 20 10  Temp: 36.7 C (!) 36.4 C  SpO2: 100% 95%    Last Pain:  Vitals:   12/29/16 0753  TempSrc: Oral         Complications: No apparent anesthesia complications

## 2016-12-29 NOTE — Anesthesia Preprocedure Evaluation (Signed)
Anesthesia Evaluation  Patient identified by MRN, date of birth, ID band Patient awake    Reviewed: Allergy & Precautions, NPO status , Patient's Chart, lab work & pertinent test results, reviewed documented beta blocker date and time   Airway Mallampati: II  TM Distance: >3 FB Neck ROM: Full    Dental no notable dental hx.    Pulmonary neg pulmonary ROS,    Pulmonary exam normal breath sounds clear to auscultation       Cardiovascular hypertension, Pt. on medications and Pt. on home beta blockers negative cardio ROS Normal cardiovascular exam Rhythm:Regular Rate:Normal     Neuro/Psych  Headaches, negative neurological ROS  negative psych ROS   GI/Hepatic negative GI ROS, Neg liver ROS,   Endo/Other  negative endocrine ROSdiabetes, Type 2, Oral Hypoglycemic Agents  Renal/GU negative Renal ROS  negative genitourinary   Musculoskeletal negative musculoskeletal ROS (+) Arthritis , Osteoarthritis,    Abdominal   Peds negative pediatric ROS (+)  Hematology negative hematology ROS (+)   Anesthesia Other Findings   Reproductive/Obstetrics negative OB ROS                             Anesthesia Physical Anesthesia Plan  ASA: III  Anesthesia Plan: General   Post-op Pain Management:    Induction: Intravenous  PONV Risk Score and Plan: 3 and Ondansetron, Dexamethasone and Midazolam  Airway Management Planned: Oral ETT  Additional Equipment:   Intra-op Plan:   Post-operative Plan: Extubation in OR  Informed Consent: I have reviewed the patients History and Physical, chart, labs and discussed the procedure including the risks, benefits and alternatives for the proposed anesthesia with the patient or authorized representative who has indicated his/her understanding and acceptance.   Dental advisory given  Plan Discussed with: CRNA  Anesthesia Plan Comments:         Anesthesia  Quick Evaluation

## 2016-12-30 ENCOUNTER — Encounter (HOSPITAL_COMMUNITY): Payer: Self-pay | Admitting: Surgery

## 2017-01-11 DIAGNOSIS — E210 Primary hyperparathyroidism: Secondary | ICD-10-CM | POA: Diagnosis not present

## 2017-04-29 ENCOUNTER — Other Ambulatory Visit: Payer: Self-pay | Admitting: Internal Medicine

## 2017-05-10 ENCOUNTER — Other Ambulatory Visit: Payer: Self-pay | Admitting: Internal Medicine

## 2017-05-17 ENCOUNTER — Ambulatory Visit (HOSPITAL_COMMUNITY)
Admission: RE | Admit: 2017-05-17 | Discharge: 2017-05-17 | Disposition: A | Discharge status: Home / Self Care | Payer: Medicare Other | Source: Ambulatory Visit | Attending: Family Medicine | Admitting: Family Medicine

## 2017-05-17 ENCOUNTER — Encounter: Payer: Self-pay | Admitting: Family Medicine

## 2017-05-17 ENCOUNTER — Ambulatory Visit (HOSPITAL_COMMUNITY): Payer: Medicare Other

## 2017-05-17 ENCOUNTER — Other Ambulatory Visit: Payer: Self-pay

## 2017-05-17 ENCOUNTER — Ambulatory Visit (INDEPENDENT_AMBULATORY_CARE_PROVIDER_SITE_OTHER): Payer: Medicare Other | Admitting: Family Medicine

## 2017-05-17 VITALS — BP 168/88 | HR 58 | Temp 98.0°F | Ht 63.0 in | Wt 181.2 lb

## 2017-05-17 DIAGNOSIS — R079 Chest pain, unspecified: Secondary | ICD-10-CM | POA: Insufficient documentation

## 2017-05-17 DIAGNOSIS — R001 Bradycardia, unspecified: Secondary | ICD-10-CM | POA: Diagnosis not present

## 2017-05-17 DIAGNOSIS — E11311 Type 2 diabetes mellitus with unspecified diabetic retinopathy with macular edema: Secondary | ICD-10-CM

## 2017-05-17 DIAGNOSIS — M62838 Other muscle spasm: Secondary | ICD-10-CM

## 2017-05-17 LAB — POCT GLYCOSYLATED HEMOGLOBIN (HGB A1C): HEMOGLOBIN A1C: 6.5

## 2017-05-17 NOTE — Patient Instructions (Addendum)
It was great meeting you today Sandra [name]! I am glad that you have been doing very well since your surgery. Congratulations, your blood sugar control has been great. Your A1C has improved to 6.4 from 7.1 at last check back in September. Regarding your cholesterol medication, I believe that you will get tremendous benefit. We came to the conclusion that you will take this every other day, as this will likely decrease your muscle aches and provide some overall benefit to your health.  Regarding your leg pain, I believe that you are most likely having leg cramps/muscle spasms. When you feel one of these spasms occurring, I recommend taking 2 325mg  tylenol tablet and using a heating pad for about 30 minutes to 1 hour. You can use the heating pad as much as needed but please only take this dose of tylenol every 6 hours at the maximum.  Regarding your chest pain. We performed an ekg in the clinic today. It came back without any concerning findings. I believe your chest pain is most likely a combination of acid reflux and muscle pain from overuse. Continue to take your omeprazole for your reflux and the tylenol should help with the muscle pain. Obviously the ekg is just a snapshot in time so it is possible there could still be a cardiac issue even since the ekg is normal. If you have worsening chest pain, shortness of breath, or any other concerning findings, please either call the clinic or go to the emergency department. I will see you back in 3 months for another A1C check for your diabetes, or sooner as needed.  Have a great day, and thank you for choosing [facility] for your primary care needs.

## 2017-05-18 DIAGNOSIS — M62838 Other muscle spasm: Secondary | ICD-10-CM | POA: Insufficient documentation

## 2017-05-18 NOTE — Assessment & Plan Note (Signed)
A1C 6.4 which has improved from 7.1 at last check. No symptoms of neuropathy on foot exam.  Will continue current regimen and will have her follow up in 3 months for recheck. Will also get urine microalbumin at that visit. Have asked patient to send in optometry records.

## 2017-05-18 NOTE — Assessment & Plan Note (Signed)
Got EKG in clinic giving substernal chest pain, new onset, with activity. EKG with bradycardia (beta blocked) and NSR. Likely due to a combination of musculoskeletal factors as well as GERD. Gave strict return precuations.

## 2017-05-18 NOTE — Assessment & Plan Note (Signed)
Tightness in leg likely muscle cramp/spasms. Gave instructions on using tylenol and a heating pad. Could consider very low dose of muscle relaxer but will hold off for now, especially in a 67 year old.

## 2017-05-18 NOTE — Progress Notes (Signed)
  HPI:  Patient presents today for a new patient appointment to establish general primary care, also to discuss leg cramps and chest pain.  Patient states that her chest pain started about 2 weeks ago. It comes and goes. Mid-sternal but does not radiate. Is painful to palpation in that area. Has had no shortness of breath or accompanying symptoms. Has never had this pain before. Occasionally feels this pain with activity.  She states that she will occasionally feel a tightness in right calf. She has noticed no swelling and it is not a sharp pain.   ROS: See HPI  Past Medical Hx:  - HTN - T2DM - GERD - HLD - Osteoarthritis  Past Surgical Hx:  - parathyroidectomy - tubal ligation - colonoscopy  Family Hx: updated in Epic  Social Hx: lives at home with husband Cleans houses for occupation No tobacco, etoh, illicits  Health Maintenance:  - needs hep c screen - hiv screen - optho exam - urine microalbumin - pap smear  PHYSICAL EXAM: BP (!) 168/88   Pulse (!) 58   Temp 98 F (36.7 C) (Oral)   Ht 5\' 3"  (1.6 m)   Wt 181 lb 3.2 oz (82.2 kg)   SpO2 99%   BMI 32.10 kg/m  Gen: alert, oriented x3. Well appearing AA female. No acute distress HEENT: bilaterally tm membranes without abnormality, mild cerumen found in both ears. No internal or external trauma to nose Heart: bradycardia, mo m/r/g, palpable peripheral pulses. 1+ pitting edema BLE. Symmetric BLE. Lungs: lungs clear to ausculation bilaterally, symmetric chest rise,  Abdomen: soft, non-tender, non-distended Neuro: ao x3, no focal neuro deficits, 5/5 strength BLE, BUE  ASSESSMENT/PLAN:  # Health maintenance:  - Needs hep C - HIV screening - Mammogram - Opthalmology Exam - Urine Microalbuminuria - Pap Smear  Type 2 diabetes, controlled, with retinopathy A1C 6.4 which has improved from 7.1 at last check. No symptoms of neuropathy on foot exam.  Will continue current regimen and will have her follow up in 3  months for recheck. Will also get urine microalbumin at that visit. Have asked patient to send in optometry records.   Chest pain Got EKG in clinic giving substernal chest pain, new onset, with activity. EKG with bradycardia (beta blocked) and NSR. Likely due to a combination of musculoskeletal factors as well as GERD. Gave strict return precuations.  Muscle spasms of both lower extremities Tightness in leg likely muscle cramp/spasms. Gave instructions on using tylenol and a heating pad. Could consider very low dose of muscle relaxer but will hold off for now, especially in a 67 year old.     FOLLOW UP: Follow up in 3 months for a1c check  Guadalupe Dawn MD PGY-1 Family Medicine Resident

## 2017-05-22 ENCOUNTER — Encounter: Payer: Self-pay | Admitting: Internal Medicine

## 2017-06-09 ENCOUNTER — Other Ambulatory Visit: Payer: Self-pay | Admitting: Internal Medicine

## 2017-08-08 ENCOUNTER — Other Ambulatory Visit: Payer: Self-pay | Admitting: Internal Medicine

## 2017-08-15 ENCOUNTER — Other Ambulatory Visit: Payer: Self-pay | Admitting: Internal Medicine

## 2017-08-21 ENCOUNTER — Other Ambulatory Visit: Payer: Self-pay

## 2017-08-21 ENCOUNTER — Encounter: Payer: Self-pay | Admitting: Family Medicine

## 2017-08-21 ENCOUNTER — Ambulatory Visit (INDEPENDENT_AMBULATORY_CARE_PROVIDER_SITE_OTHER): Payer: Medicare Other | Admitting: Family Medicine

## 2017-08-21 ENCOUNTER — Telehealth: Payer: Self-pay | Admitting: Family Medicine

## 2017-08-21 VITALS — BP 200/92 | HR 58 | Temp 98.3°F | Wt 181.6 lb

## 2017-08-21 DIAGNOSIS — E113553 Type 2 diabetes mellitus with stable proliferative diabetic retinopathy, bilateral: Secondary | ICD-10-CM | POA: Diagnosis not present

## 2017-08-21 DIAGNOSIS — E785 Hyperlipidemia, unspecified: Secondary | ICD-10-CM

## 2017-08-21 DIAGNOSIS — E861 Hypovolemia: Secondary | ICD-10-CM | POA: Diagnosis not present

## 2017-08-21 DIAGNOSIS — I9589 Other hypotension: Secondary | ICD-10-CM

## 2017-08-21 DIAGNOSIS — M62838 Other muscle spasm: Secondary | ICD-10-CM | POA: Diagnosis not present

## 2017-08-21 LAB — POCT GLYCOSYLATED HEMOGLOBIN (HGB A1C): HbA1c, POC (controlled diabetic range): 6.4 % (ref 0.0–7.0)

## 2017-08-21 MED ORDER — CYCLOBENZAPRINE HCL 5 MG PO TABS: ORAL_TABLET | ORAL | 0 refills | Status: DC

## 2017-08-21 NOTE — Patient Instructions (Addendum)
It was great seeing you again today! Congratulations on your diabetic control. Your a1c has improved from 6.5 to 6.4 today. Given that you have had great control for 3 months now, I think we can stop the glipizide. This medication can cause you to have blood sugar lows. We can continue the metformin 500mg  two times per day. This medication helps your cells become more sensitive to the insulin you already make in your body, so the chance of blood sugar lows are much much lower.  Regarding your cholesterol medication. I am sorry that you have continued to have muscle pains even on the every other day amount. I think we can stop this medication. We can consider another class of cholesterol medications in the future but we will stop this for now.  I think your lack of balance is due to to something called orthostatic hypotension. This is a fancy way of saying that I think you are having issues maintaining your blood pressure when you stand up abruptly. There are many causes for this but I think yours is likely due to dehydration. I would set reminders for myself during the day to try and remind myself to drink fluids such as water or low calorie gatorade.  I think for your muscle spasms we can try a short course of muscle relaxers. You will take one tablet every night shortly before bed and see if this better controls your muscle pain. We can only do this medication for a short amount of time but I think it will help you relax at night. Come back and see me in a couple of weeks to see how things have been going.

## 2017-08-24 NOTE — Telephone Encounter (Signed)
Entered in error

## 2017-08-28 ENCOUNTER — Encounter: Payer: Self-pay | Admitting: Family Medicine

## 2017-08-28 NOTE — Progress Notes (Signed)
   HPI 67 year old who presents for a1c check. She ws 6.4 today improved from 6.5. She feels like she has intermittently been having some lows, where she will get fairly fatigued and need to eat something.   Patient feels like she is having a lot of muscle pain in her legs. She attributes this to the atorvastatin. Even with every other day dosing she feels like her legs ache tremendously to the point to where she stopped taking it.  She also complains of a phenomenon where she will try and get up a little too fast and feel light-headed. This does not happen all of the time but usually on a hot day when she has not been hydrating well.  She feels like she has been having muscle spasms at night. These started after she started taking the atorvastatin again. Her legs will cramp when she is in the bed.  CC: a1c check   ROS:   Review of Systems See HPI for ROS.   CC, SH/smoking status, and VS noted  Objective: BP (!) 200/92   Pulse (!) 58   Temp 98.3 F (36.8 C) (Oral)   Wt 181 lb 9.6 oz (82.4 kg)   SpO2 99%   BMI 32.17 kg/m  Gen: NAD, alert, cooperative, and pleasant. HEENT: NCAT, EOMI, PERRL CV: RRR, no murmur Resp: CTAB, no wheezes, non-labored Abd: SNTND, BS present, no guarding or organomegaly Ext: No edema, warm Neuro: Alert and oriented, Speech clear, No gross deficits   Assessment and plan:  Hypotension due to hypovolemia Likely due to orthostatics. Encouraged patient to take it a little more slowly when getting up. Also encouraged to hydrate well. Her goal is to drink enough to where she is not thirsty as there is no imaginary goal for hydration that everyone can subscribe to.  Hyperlipidemia Will hold statin therapy. Will likely switch to 2nd line therapy at next visit.  Type 2 diabetes, controlled, with retinopathy Doing well from this standpoint. Essentially stable. Due to the lows, will stop glipizide as she is well within goal. - stop glipizide - continue  metformin 500mg  bid - a1c check in 3 months  Muscle spasms of both lower extremities Unclear if spasms 2/2 statin therapy or from other cause. She certainly chalks it up to statins. Will try short course of flexeril to help her in acute phase.   Orders Placed This Encounter  Procedures  . HgB A1c    Meds ordered this encounter  Medications  . cyclobenzaprine (FLEXERIL) 5 MG tablet    Sig: Take 1 5mg  tablet every night shortly before bed, as needed for muscle aches    Dispense:  14 tablet    Refill:  0     Guadalupe Dawn MD PGY-1 Family Medicine Resident  08/28/2017 8:22 AM

## 2017-08-28 NOTE — Assessment & Plan Note (Signed)
Doing well from this standpoint. Essentially stable. Due to the lows, will stop glipizide as she is well within goal. - stop glipizide - continue metformin 500mg  bid - a1c check in 3 months

## 2017-08-28 NOTE — Assessment & Plan Note (Signed)
Likely due to orthostatics. Encouraged patient to take it a little more slowly when getting up. Also encouraged to hydrate well. Her goal is to drink enough to where she is not thirsty as there is no imaginary goal for hydration that everyone can subscribe to.

## 2017-08-28 NOTE — Assessment & Plan Note (Signed)
Unclear if spasms 2/2 statin therapy or from other cause. She certainly chalks it up to statins. Will try short course of flexeril to help her in acute phase.

## 2017-08-28 NOTE — Assessment & Plan Note (Signed)
Will hold statin therapy. Will likely switch to 2nd line therapy at next visit.

## 2017-08-30 ENCOUNTER — Other Ambulatory Visit: Payer: Self-pay | Admitting: Internal Medicine

## 2017-08-31 ENCOUNTER — Other Ambulatory Visit: Payer: Self-pay | Admitting: *Deleted

## 2017-08-31 DIAGNOSIS — M4316 Spondylolisthesis, lumbar region: Secondary | ICD-10-CM | POA: Diagnosis not present

## 2017-08-31 DIAGNOSIS — M545 Low back pain: Secondary | ICD-10-CM | POA: Diagnosis not present

## 2017-08-31 MED ORDER — TRIAMTERENE-HCTZ 37.5-25 MG PO TABS: 1.0000 | ORAL_TABLET | Freq: Every day | ORAL | 0 refills | Status: DC

## 2017-09-10 DIAGNOSIS — M545 Low back pain: Secondary | ICD-10-CM | POA: Diagnosis not present

## 2017-09-22 DIAGNOSIS — M48061 Spinal stenosis, lumbar region without neurogenic claudication: Secondary | ICD-10-CM | POA: Diagnosis not present

## 2017-09-22 DIAGNOSIS — M545 Low back pain: Secondary | ICD-10-CM | POA: Diagnosis not present

## 2017-09-22 DIAGNOSIS — M4316 Spondylolisthesis, lumbar region: Secondary | ICD-10-CM | POA: Diagnosis not present

## 2017-10-11 ENCOUNTER — Other Ambulatory Visit: Payer: Self-pay | Admitting: Neurosurgery

## 2017-10-11 DIAGNOSIS — M48062 Spinal stenosis, lumbar region with neurogenic claudication: Secondary | ICD-10-CM | POA: Diagnosis not present

## 2017-10-11 DIAGNOSIS — I10 Essential (primary) hypertension: Secondary | ICD-10-CM | POA: Diagnosis not present

## 2017-10-24 NOTE — Pre-Procedure Instructions (Signed)
Sandra [name]  10/24/2017      [place], [place] [address] Phone: [phone] Fax: [phone]  [facility] [address] [place], [place] - [place] [address] Phone: [phone] Fax: [phone]    Your procedure is scheduled on August 23  Report to [facility] at [email or web address].M.  Call this number if you have problems the morning of surgery:  [phone]   Remember:  Do not eat or drink after midnight.     Take these medicines the morning of surgery with A SIP OF WATER  acetaminophen (TYLENOL)  HYDROcodone-acetaminophen (NORCO/VICODIN) metoprolol succinate (TOPROL-XL) omeprazole (PRILOSEC)  7 days prior to surgery STOP taking any Aspirin(unless otherwise instructed by your surgeon), Aleve, Naproxen, Ibuprofen, Motrin, Advil, Goody's, BC's, all herbal medications, fish oil, and all vitamins   WHAT DO I DO ABOUT MY DIABETES MEDICATION?   Marland Kitchen Do not take oral diabetes medicines (pills) the morning of surgery. metFORMIN (GLUCOPHAGE)  How to Manage Your Diabetes Before and After Surgery  Why is it important to control my blood sugar before and after surgery? . Improving blood sugar levels before and after surgery helps healing and can limit problems. . A way of improving blood sugar control is eating a healthy diet by: o  Eating less sugar and carbohydrates o  Increasing activity/exercise o  Talking with your doctor about reaching your blood sugar goals . High blood sugars (greater than 180 mg/dL) can raise your risk of infections and slow your recovery, so you will need to focus on controlling your diabetes during the weeks before surgery. . Make sure that the doctor who takes care of your diabetes knows about your planned surgery including the date and location.  How do I manage my blood sugar before surgery? . Check your blood  sugar at least 4 times a day, starting 2 days before surgery, to make sure that the level is not too high or low. o Check your blood sugar the morning of your surgery when you wake up and every 2 hours until you get to the Short Stay unit. . If your blood sugar is less than 70 mg/dL, you will need to treat for low blood sugar: o Do not take insulin. o Treat a low blood sugar (less than 70 mg/dL) with  cup of clear juice (cranberry or apple), 4 glucose tablets, OR glucose gel. o Recheck blood sugar in 15 minutes after treatment (to make sure it is greater than 70 mg/dL). If your blood sugar is not greater than 70 mg/dL on recheck, call [phone] for further instructions. . Report your blood sugar to the short stay nurse when you get to Short Stay.  . If you are admitted to the hospital after surgery: o Your blood sugar will be checked by the staff and you will probably be given insulin after surgery (instead of oral diabetes medicines) to make sure you have good blood sugar levels. o The goal for blood sugar control after surgery is 80-180 mg/dL.    Do not wear jewelry, make-up or nail polish.  Do not wear lotions, powders, or perfumes, or deodorant.  Do not shave 48 hours prior to surgery.  Men may shave face and neck.  Do not bring valuables to the hospital.  [facility] is not responsible for any belongings or valuables.  Contacts, dentures or bridgework may not  be worn into surgery.  Leave your suitcase in the car.  After surgery it may be brought to your room.  For patients admitted to the hospital, discharge time will be determined by your treatment team.  Patients discharged the day of surgery will not be allowed to drive home.    Special instructions:   [facility]- Preparing For Surgery  Before surgery, you can play an important role. Because skin is not sterile, your skin needs to be as free of germs as possible. You can reduce the number of germs on your skin by washing with  CHG (chlorahexidine gluconate) Soap before surgery.  CHG is an antiseptic cleaner which kills germs and bonds with the skin to continue killing germs even after washing.    Oral Hygiene is also important to reduce your risk of infection.  Remember - BRUSH YOUR TEETH THE MORNING OF SURGERY WITH YOUR REGULAR TOOTHPASTE  Please do not use if you have an allergy to CHG or antibacterial soaps. If your skin becomes reddened/irritated stop using the CHG.  Do not shave (including legs and underarms) for at least 48 hours prior to first CHG shower. It is OK to shave your face.  Please follow these instructions carefully.   1. Shower the NIGHT BEFORE SURGERY and the MORNING OF SURGERY with CHG.   2. If you chose to wash your hair, wash your hair first as usual with your normal shampoo.  3. After you shampoo, rinse your hair and body thoroughly to remove the shampoo.  4. Use CHG as you would any other liquid soap. You can apply CHG directly to the skin and wash gently with a scrungie or a clean washcloth.   5. Apply the CHG Soap to your body ONLY FROM THE NECK DOWN.  Do not use on open wounds or open sores. Avoid contact with your eyes, ears, mouth and genitals (private parts). Wash Face and genitals (private parts)  with your normal soap.  6. Wash thoroughly, paying special attention to the area where your surgery will be performed.  7. Thoroughly rinse your body with warm water from the neck down.  8. DO NOT shower/wash with your normal soap after using and rinsing off the CHG Soap.  9. Pat yourself dry with a CLEAN TOWEL.  10. Wear CLEAN PAJAMAS to bed the night before surgery, wear comfortable clothes the morning of surgery  11. Place CLEAN SHEETS on your bed the night of your first shower and DO NOT SLEEP WITH PETS.    Day of Surgery:  Do not apply any deodorants/lotions.  Please wear clean clothes to the hospital/surgery center.   Remember to brush your teeth WITH YOUR REGULAR  TOOTHPASTE.    Please read over the following fact sheets that you were given.

## 2017-10-25 ENCOUNTER — Encounter (HOSPITAL_COMMUNITY)
Admission: RE | Admit: 2017-10-25 | Discharge: 2017-10-25 | Disposition: A | Discharge status: Home / Self Care | Payer: Medicare Other | Source: Ambulatory Visit | Attending: Neurosurgery | Admitting: Neurosurgery

## 2017-10-25 ENCOUNTER — Encounter (HOSPITAL_COMMUNITY): Payer: Self-pay

## 2017-10-25 ENCOUNTER — Other Ambulatory Visit: Payer: Self-pay

## 2017-10-25 DIAGNOSIS — Z01812 Encounter for preprocedural laboratory examination: Secondary | ICD-10-CM | POA: Diagnosis not present

## 2017-10-25 LAB — CBC
HEMATOCRIT: 35.6 % — AB (ref 36.0–46.0)
Hemoglobin: 11.3 g/dL — ABNORMAL LOW (ref 12.0–15.0)
MCH: 29 pg (ref 26.0–34.0)
MCHC: 31.7 g/dL (ref 30.0–36.0)
MCV: 91.3 fL (ref 78.0–100.0)
Platelets: 343 10*3/uL (ref 150–400)
RBC: 3.9 MIL/uL (ref 3.87–5.11)
RDW: 14.3 % (ref 11.5–15.5)
WBC: 5.2 10*3/uL (ref 4.0–10.5)

## 2017-10-25 LAB — SURGICAL PCR SCREEN
MRSA, PCR: NEGATIVE
Staphylococcus aureus: NEGATIVE

## 2017-10-25 LAB — BASIC METABOLIC PANEL
Anion gap: 11 (ref 5–15)
BUN: 18 mg/dL (ref 8–23)
CHLORIDE: 102 mmol/L (ref 98–111)
CO2: 24 mmol/L (ref 22–32)
CREATININE: 1.1 mg/dL — AB (ref 0.44–1.00)
Calcium: 9.5 mg/dL (ref 8.9–10.3)
GFR calc Af Amer: 59 mL/min — ABNORMAL LOW (ref >60)
GFR calc non Af Amer: 51 mL/min — ABNORMAL LOW (ref >60)
Glucose, Bld: 187 mg/dL — ABNORMAL HIGH (ref 70–99)
Potassium: 4.5 mmol/L (ref 3.5–5.1)
Sodium: 137 mmol/L (ref 135–145)

## 2017-10-25 LAB — GLUCOSE, CAPILLARY: Glucose-Capillary: 162 mg/dL — ABNORMAL HIGH (ref 70–99)

## 2017-10-25 NOTE — Progress Notes (Signed)
Pharmacy tech paged for med rec to be completed

## 2017-10-25 NOTE — Progress Notes (Signed)
PCP - Guadalupe Dawn Cardiologist - denies  Chest x-ray - not needed EKG - 05/17/17 Stress Test -denies  ECHO - denies Cardiac Cath - denies  Fasting Blood Sugar - 60s Checks Blood Sugar sporadic, instructed patient to check more often since her sugars have been running low in the mornings   Anesthesia review: NO  Patient denies shortness of breath, fever, cough and chest pain at PAT appointment   Patient verbalized understanding of instructions that were given to them at the PAT appointment. Patient was also instructed that they will need to review over the PAT instructions again at home before surgery.

## 2017-10-29 ENCOUNTER — Other Ambulatory Visit: Payer: Self-pay | Admitting: Family Medicine

## 2017-11-02 ENCOUNTER — Ambulatory Visit (HOSPITAL_COMMUNITY): Payer: Medicare Other

## 2017-11-02 ENCOUNTER — Encounter (HOSPITAL_COMMUNITY)
Admission: RE | Disposition: A | Discharge status: Home / Self Care | Payer: Self-pay | Source: Ambulatory Visit | Attending: Neurosurgery

## 2017-11-02 ENCOUNTER — Encounter (HOSPITAL_COMMUNITY): Payer: Self-pay | Admitting: *Deleted

## 2017-11-02 ENCOUNTER — Ambulatory Visit (HOSPITAL_COMMUNITY)
Admission: RE | Admit: 2017-11-02 | Discharge: 2017-11-03 | Disposition: A | Discharge status: Home / Self Care | Payer: Medicare Other | Source: Ambulatory Visit | Attending: Neurosurgery | Admitting: Neurosurgery

## 2017-11-02 ENCOUNTER — Ambulatory Visit (HOSPITAL_COMMUNITY): Payer: Medicare Other | Admitting: Anesthesiology

## 2017-11-02 DIAGNOSIS — Z419 Encounter for procedure for purposes other than remedying health state, unspecified: Secondary | ICD-10-CM

## 2017-11-02 DIAGNOSIS — Z888 Allergy status to other drugs, medicaments and biological substances status: Secondary | ICD-10-CM | POA: Insufficient documentation

## 2017-11-02 DIAGNOSIS — K219 Gastro-esophageal reflux disease without esophagitis: Secondary | ICD-10-CM | POA: Insufficient documentation

## 2017-11-02 DIAGNOSIS — G2581 Restless legs syndrome: Secondary | ICD-10-CM | POA: Diagnosis not present

## 2017-11-02 DIAGNOSIS — M1990 Unspecified osteoarthritis, unspecified site: Secondary | ICD-10-CM | POA: Diagnosis not present

## 2017-11-02 DIAGNOSIS — E785 Hyperlipidemia, unspecified: Secondary | ICD-10-CM | POA: Insufficient documentation

## 2017-11-02 DIAGNOSIS — Z8249 Family history of ischemic heart disease and other diseases of the circulatory system: Secondary | ICD-10-CM | POA: Insufficient documentation

## 2017-11-02 DIAGNOSIS — M532X6 Spinal instabilities, lumbar region: Secondary | ICD-10-CM | POA: Insufficient documentation

## 2017-11-02 DIAGNOSIS — I341 Nonrheumatic mitral (valve) prolapse: Secondary | ICD-10-CM | POA: Insufficient documentation

## 2017-11-02 DIAGNOSIS — R011 Cardiac murmur, unspecified: Secondary | ICD-10-CM | POA: Insufficient documentation

## 2017-11-02 DIAGNOSIS — M5416 Radiculopathy, lumbar region: Secondary | ICD-10-CM | POA: Diagnosis not present

## 2017-11-02 DIAGNOSIS — G9619 Other disorders of meninges, not elsewhere classified: Secondary | ICD-10-CM | POA: Insufficient documentation

## 2017-11-02 DIAGNOSIS — Z886 Allergy status to analgesic agent status: Secondary | ICD-10-CM | POA: Insufficient documentation

## 2017-11-02 DIAGNOSIS — M48061 Spinal stenosis, lumbar region without neurogenic claudication: Secondary | ICD-10-CM | POA: Diagnosis not present

## 2017-11-02 DIAGNOSIS — Z79899 Other long term (current) drug therapy: Secondary | ICD-10-CM | POA: Diagnosis not present

## 2017-11-02 DIAGNOSIS — Z7984 Long term (current) use of oral hypoglycemic drugs: Secondary | ICD-10-CM | POA: Insufficient documentation

## 2017-11-02 DIAGNOSIS — I509 Heart failure, unspecified: Secondary | ICD-10-CM | POA: Diagnosis not present

## 2017-11-02 DIAGNOSIS — E119 Type 2 diabetes mellitus without complications: Secondary | ICD-10-CM | POA: Insufficient documentation

## 2017-11-02 DIAGNOSIS — I110 Hypertensive heart disease with heart failure: Secondary | ICD-10-CM | POA: Insufficient documentation

## 2017-11-02 DIAGNOSIS — I10 Essential (primary) hypertension: Secondary | ICD-10-CM | POA: Diagnosis not present

## 2017-11-02 DIAGNOSIS — Z981 Arthrodesis status: Secondary | ICD-10-CM | POA: Diagnosis not present

## 2017-11-02 HISTORY — PX: LUMBAR LAMINECTOMY/DECOMPRESSION MICRODISCECTOMY: SHX5026

## 2017-11-02 LAB — GLUCOSE, CAPILLARY
GLUCOSE-CAPILLARY: 121 mg/dL — AB (ref 70–99)
GLUCOSE-CAPILLARY: 129 mg/dL — AB (ref 70–99)
GLUCOSE-CAPILLARY: 254 mg/dL — AB (ref 70–99)
GLUCOSE-CAPILLARY: 209 mg/dL — AB (ref 70–99)

## 2017-11-02 SURGERY — LUMBAR LAMINECTOMY/DECOMPRESSION MICRODISCECTOMY 2 LEVELS
Anesthesia: General | Site: Back

## 2017-11-02 MED ORDER — FENTANYL CITRATE (PF) 100 MCG/2ML IJ SOLN
INTRAMUSCULAR | Status: DC | PRN
  Administered 2017-11-02: 50 ug via INTRAVENOUS
  Administered 2017-11-02: 100 ug via INTRAVENOUS
  Administered 2017-11-02 (×2): 50 ug via INTRAVENOUS

## 2017-11-02 MED ORDER — ACETAMINOPHEN 325 MG PO TABS: 650.0000 mg | ORAL_TABLET | ORAL | Status: DC | PRN

## 2017-11-02 MED ORDER — SODIUM CHLORIDE 0.9% FLUSH: 3.0000 mL | Freq: Two times a day (BID) | INTRAVENOUS | Status: DC

## 2017-11-02 MED ORDER — PROPOFOL 10 MG/ML IV BOLUS
INTRAVENOUS | Status: DC | PRN
  Administered 2017-11-02: 120 mg via INTRAVENOUS

## 2017-11-02 MED ORDER — FENTANYL CITRATE (PF) 100 MCG/2ML IJ SOLN: 25.0000 ug | INTRAMUSCULAR | Status: DC | PRN

## 2017-11-02 MED ORDER — OXYCODONE HCL 5 MG/5ML PO SOLN: 5.0000 mg | Freq: Once | ORAL | Status: DC | PRN

## 2017-11-02 MED ORDER — LIDOCAINE-EPINEPHRINE 1 %-1:100000 IJ SOLN
INTRAMUSCULAR | Status: AC
  Filled 2017-11-02: qty 1

## 2017-11-02 MED ORDER — ROCURONIUM BROMIDE 50 MG/5ML IV SOSY
PREFILLED_SYRINGE | INTRAVENOUS | Status: AC
  Filled 2017-11-02: qty 5

## 2017-11-02 MED ORDER — HYDROMORPHONE HCL 1 MG/ML IJ SOLN
0.5000 mg | INTRAMUSCULAR | Status: DC | PRN
  Administered 2017-11-02: 0.5 mg via INTRAVENOUS
  Filled 2017-11-02: qty 0.5

## 2017-11-02 MED ORDER — CEFAZOLIN SODIUM-DEXTROSE 2-4 GM/100ML-% IV SOLN
2.0000 g | INTRAVENOUS | Status: AC
  Administered 2017-11-02: 2 g via INTRAVENOUS
  Filled 2017-11-02: qty 100

## 2017-11-02 MED ORDER — OXYCODONE HCL 5 MG PO TABS
5.0000 mg | ORAL_TABLET | ORAL | Status: DC | PRN
  Administered 2017-11-02 – 2017-11-03 (×5): 10 mg via ORAL
  Filled 2017-11-02 (×5): qty 2

## 2017-11-02 MED ORDER — SODIUM CHLORIDE 0.9 % IV SOLN: 250.0000 mL | INTRAVENOUS | Status: DC

## 2017-11-02 MED ORDER — CYANOCOBALAMIN 500 MCG PO TABS
500.0000 ug | ORAL_TABLET | Freq: Every day | ORAL | Status: DC
  Filled 2017-11-02: qty 1

## 2017-11-02 MED ORDER — MENTHOL 3 MG MT LOZG: 1.0000 | LOZENGE | OROMUCOSAL | Status: DC | PRN

## 2017-11-02 MED ORDER — THROMBIN 5000 UNITS EX SOLR
CUTANEOUS | Status: DC | PRN
  Administered 2017-11-02 (×2): 5000 [IU] via TOPICAL

## 2017-11-02 MED ORDER — PANTOPRAZOLE SODIUM 40 MG PO TBEC
80.0000 mg | DELAYED_RELEASE_TABLET | Freq: Every day | ORAL | Status: DC
  Administered 2017-11-03: 80 mg via ORAL
  Filled 2017-11-02: qty 2

## 2017-11-02 MED ORDER — LIDOCAINE 2% (20 MG/ML) 5 ML SYRINGE
INTRAMUSCULAR | Status: AC
  Filled 2017-11-02: qty 5

## 2017-11-02 MED ORDER — SODIUM CHLORIDE 0.9% FLUSH: 3.0000 mL | INTRAVENOUS | Status: DC | PRN

## 2017-11-02 MED ORDER — HYDROCODONE-ACETAMINOPHEN 5-325 MG PO TABS: 1.0000 | ORAL_TABLET | ORAL | Status: DC | PRN

## 2017-11-02 MED ORDER — ACETAMINOPHEN 650 MG RE SUPP: 650.0000 mg | RECTAL | Status: DC | PRN

## 2017-11-02 MED ORDER — LIDOCAINE HCL (CARDIAC) PF 100 MG/5ML IV SOSY
PREFILLED_SYRINGE | INTRAVENOUS | Status: DC | PRN
  Administered 2017-11-02: 60 mg via INTRAVENOUS

## 2017-11-02 MED ORDER — OXYCODONE HCL 5 MG PO TABS: 5.0000 mg | ORAL_TABLET | Freq: Once | ORAL | Status: DC | PRN

## 2017-11-02 MED ORDER — CEFAZOLIN SODIUM-DEXTROSE 2-4 GM/100ML-% IV SOLN
2.0000 g | Freq: Three times a day (TID) | INTRAVENOUS | Status: AC
  Administered 2017-11-02 – 2017-11-03 (×2): 2 g via INTRAVENOUS
  Filled 2017-11-02 (×2): qty 100

## 2017-11-02 MED ORDER — BUPIVACAINE HCL (PF) 0.25 % IJ SOLN
INTRAMUSCULAR | Status: DC | PRN
  Administered 2017-11-02: 10 mL

## 2017-11-02 MED ORDER — HEMOSTATIC AGENTS (NO CHARGE) OPTIME
TOPICAL | Status: DC | PRN
  Administered 2017-11-02: 1

## 2017-11-02 MED ORDER — METFORMIN HCL 500 MG PO TABS
500.0000 mg | ORAL_TABLET | Freq: Two times a day (BID) | ORAL | Status: DC
  Administered 2017-11-02 – 2017-11-03 (×2): 500 mg via ORAL
  Filled 2017-11-02 (×2): qty 1

## 2017-11-02 MED ORDER — DEXAMETHASONE SODIUM PHOSPHATE 10 MG/ML IJ SOLN
10.0000 mg | INTRAMUSCULAR | Status: DC
  Filled 2017-11-02: qty 1

## 2017-11-02 MED ORDER — ONDANSETRON HCL 4 MG PO TABS: 4.0000 mg | ORAL_TABLET | Freq: Four times a day (QID) | ORAL | Status: DC | PRN

## 2017-11-02 MED ORDER — DEXAMETHASONE SODIUM PHOSPHATE 10 MG/ML IJ SOLN
INTRAMUSCULAR | Status: AC
  Filled 2017-11-02: qty 1

## 2017-11-02 MED ORDER — ONDANSETRON HCL 4 MG/2ML IJ SOLN
INTRAMUSCULAR | Status: AC
  Filled 2017-11-02: qty 2

## 2017-11-02 MED ORDER — SUGAMMADEX SODIUM 200 MG/2ML IV SOLN
INTRAVENOUS | Status: DC | PRN
  Administered 2017-11-02: 300 mg via INTRAVENOUS

## 2017-11-02 MED ORDER — PROPOFOL 10 MG/ML IV BOLUS
INTRAVENOUS | Status: AC
  Filled 2017-11-02: qty 20

## 2017-11-02 MED ORDER — ROCURONIUM BROMIDE 100 MG/10ML IV SOLN
INTRAVENOUS | Status: DC | PRN
  Administered 2017-11-02: 30 mg via INTRAVENOUS
  Administered 2017-11-02: 50 mg via INTRAVENOUS

## 2017-11-02 MED ORDER — MIDAZOLAM HCL 2 MG/2ML IJ SOLN
INTRAMUSCULAR | Status: AC
  Filled 2017-11-02: qty 2

## 2017-11-02 MED ORDER — CHLORHEXIDINE GLUCONATE CLOTH 2 % EX PADS: 6.0000 | MEDICATED_PAD | Freq: Once | CUTANEOUS | Status: DC

## 2017-11-02 MED ORDER — LIDOCAINE-EPINEPHRINE 1 %-1:100000 IJ SOLN
INTRAMUSCULAR | Status: DC | PRN
  Administered 2017-11-02: 10 mL

## 2017-11-02 MED ORDER — PANTOPRAZOLE SODIUM 40 MG PO TBEC: 40.0000 mg | DELAYED_RELEASE_TABLET | Freq: Every day | ORAL | Status: DC

## 2017-11-02 MED ORDER — BUPIVACAINE HCL (PF) 0.25 % IJ SOLN
INTRAMUSCULAR | Status: AC
  Filled 2017-11-02: qty 30

## 2017-11-02 MED ORDER — PHENOL 1.4 % MT LIQD: 1.0000 | OROMUCOSAL | Status: DC | PRN

## 2017-11-02 MED ORDER — ONDANSETRON HCL 4 MG/2ML IJ SOLN: 4.0000 mg | Freq: Four times a day (QID) | INTRAMUSCULAR | Status: DC | PRN

## 2017-11-02 MED ORDER — THROMBIN 5000 UNITS EX SOLR
CUTANEOUS | Status: AC
  Filled 2017-11-02: qty 10000

## 2017-11-02 MED ORDER — METOPROLOL SUCCINATE ER 100 MG PO TB24: 100.0000 mg | ORAL_TABLET | Freq: Every day | ORAL | Status: DC

## 2017-11-02 MED ORDER — FENTANYL CITRATE (PF) 250 MCG/5ML IJ SOLN
INTRAMUSCULAR | Status: AC
  Filled 2017-11-02: qty 5

## 2017-11-02 MED ORDER — 0.9 % SODIUM CHLORIDE (POUR BTL) OPTIME
TOPICAL | Status: DC | PRN
  Administered 2017-11-02 (×2): 1000 mL

## 2017-11-02 MED ORDER — DEXAMETHASONE SODIUM PHOSPHATE 4 MG/ML IJ SOLN
INTRAMUSCULAR | Status: DC | PRN
  Administered 2017-11-02: 10 mg via INTRAVENOUS

## 2017-11-02 MED ORDER — TRIAMTERENE-HCTZ 37.5-25 MG PO TABS
1.0000 | ORAL_TABLET | Freq: Every day | ORAL | Status: DC
  Administered 2017-11-02: 1 via ORAL
  Filled 2017-11-02 (×2): qty 1

## 2017-11-02 MED ORDER — LACTATED RINGERS IV SOLN
INTRAVENOUS | Status: DC
  Administered 2017-11-02 (×2): via INTRAVENOUS

## 2017-11-02 MED ORDER — GABAPENTIN 100 MG PO CAPS
100.0000 mg | ORAL_CAPSULE | Freq: Three times a day (TID) | ORAL | Status: DC
  Administered 2017-11-02 (×2): 100 mg via ORAL
  Filled 2017-11-02 (×2): qty 1

## 2017-11-02 MED ORDER — ONDANSETRON HCL 4 MG/2ML IJ SOLN
INTRAMUSCULAR | Status: DC | PRN
  Administered 2017-11-02: 4 mg via INTRAVENOUS

## 2017-11-02 MED ORDER — ALUM & MAG HYDROXIDE-SIMETH 200-200-20 MG/5ML PO SUSP: 30.0000 mL | Freq: Four times a day (QID) | ORAL | Status: DC | PRN

## 2017-11-02 MED ORDER — MIDAZOLAM HCL 5 MG/5ML IJ SOLN
INTRAMUSCULAR | Status: DC | PRN
  Administered 2017-11-02: 2 mg via INTRAVENOUS

## 2017-11-02 MED ORDER — CYCLOBENZAPRINE HCL 10 MG PO TABS
10.0000 mg | ORAL_TABLET | Freq: Three times a day (TID) | ORAL | Status: DC | PRN
  Administered 2017-11-02 – 2017-11-03 (×2): 10 mg via ORAL
  Filled 2017-11-02 (×2): qty 1

## 2017-11-02 MED ORDER — SODIUM CHLORIDE 0.9 % IV SOLN
INTRAVENOUS | Status: DC | PRN
  Administered 2017-11-02: 500 mL

## 2017-11-02 SURGICAL SUPPLY — 60 items
ADH SKN CLS APL DERMABOND .7 (GAUZE/BANDAGES/DRESSINGS) ×1
APL SKNCLS STERI-STRIP NONHPOA (GAUZE/BANDAGES/DRESSINGS) ×1
BAG DECANTER FOR FLEXI CONT (MISCELLANEOUS) ×3 IMPLANT
BENZOIN TINCTURE PRP APPL 2/3 (GAUZE/BANDAGES/DRESSINGS) ×3 IMPLANT
BLADE CLIPPER SURG (BLADE) IMPLANT
BLADE SURG 11 STRL SS (BLADE) ×3 IMPLANT
BONE VIVIGEN FORMABLE 5.4CC (Bone Implant) ×3 IMPLANT
BUR CUTTER 7.0 ROUND (BURR) ×3 IMPLANT
BUR MATCHSTICK NEURO 3.0 LAGG (BURR) ×3 IMPLANT
CANISTER SUCT 3000ML PPV (MISCELLANEOUS) ×3 IMPLANT
CARTRIDGE OIL MAESTRO DRILL (MISCELLANEOUS) ×1 IMPLANT
CLOSURE WOUND 1/2 X4 (GAUZE/BANDAGES/DRESSINGS) ×1
CONT SPEC 4OZ CLIKSEAL STRL BL (MISCELLANEOUS) ×3 IMPLANT
DECANTER SPIKE VIAL GLASS SM (MISCELLANEOUS) ×3 IMPLANT
DERMABOND ADVANCED (GAUZE/BANDAGES/DRESSINGS) ×2
DERMABOND ADVANCED .7 DNX12 (GAUZE/BANDAGES/DRESSINGS) ×1 IMPLANT
DIFFUSER DRILL AIR PNEUMATIC (MISCELLANEOUS) ×3 IMPLANT
DRAPE HALF SHEET 40X57 (DRAPES) ×3 IMPLANT
DRAPE LAPAROTOMY 100X72X124 (DRAPES) ×3 IMPLANT
DRAPE MICROSCOPE LEICA (MISCELLANEOUS) IMPLANT
DRAPE SURG 17X23 STRL (DRAPES) ×3 IMPLANT
DRSG OPSITE 4X5.5 SM (GAUZE/BANDAGES/DRESSINGS) ×3 IMPLANT
DRSG OPSITE POSTOP 4X6 (GAUZE/BANDAGES/DRESSINGS) ×3 IMPLANT
DURAPREP 26ML APPLICATOR (WOUND CARE) ×3 IMPLANT
ELECT REM PT RETURN 9FT ADLT (ELECTROSURGICAL) ×3
ELECTRODE REM PT RTRN 9FT ADLT (ELECTROSURGICAL) ×1 IMPLANT
EVACUATOR 1/8 PVC DRAIN (DRAIN) ×3 IMPLANT
GAUZE 4X4 16PLY RFD (DISPOSABLE) IMPLANT
GAUZE SPONGE 4X4 12PLY STRL (GAUZE/BANDAGES/DRESSINGS) ×3 IMPLANT
GLOVE BIO SURGEON STRL SZ7 (GLOVE) ×3 IMPLANT
GLOVE BIO SURGEON STRL SZ8 (GLOVE) ×6 IMPLANT
GLOVE BIOGEL PI IND STRL 7.0 (GLOVE) ×1 IMPLANT
GLOVE BIOGEL PI INDICATOR 7.0 (GLOVE) ×2
GLOVE ECLIPSE 7.5 STRL STRAW (GLOVE) IMPLANT
GLOVE INDICATOR 8.5 STRL (GLOVE) ×6 IMPLANT
GLOVE SURG SS PI 7.5 STRL IVOR (GLOVE) ×18 IMPLANT
GOWN STRL REUS W/ TWL LRG LVL3 (GOWN DISPOSABLE) ×3 IMPLANT
GOWN STRL REUS W/ TWL XL LVL3 (GOWN DISPOSABLE) ×1 IMPLANT
GOWN STRL REUS W/TWL 2XL LVL3 (GOWN DISPOSABLE) IMPLANT
GOWN STRL REUS W/TWL LRG LVL3 (GOWN DISPOSABLE) ×9
GOWN STRL REUS W/TWL XL LVL3 (GOWN DISPOSABLE) ×3
KIT BASIN OR (CUSTOM PROCEDURE TRAY) ×3 IMPLANT
KIT TURNOVER KIT B (KITS) ×3 IMPLANT
MILL MEDIUM DISP (BLADE) ×3 IMPLANT
NEEDLE HYPO 22GX1.5 SAFETY (NEEDLE) ×3 IMPLANT
NEEDLE SPNL 22GX3.5 QUINCKE BK (NEEDLE) ×3 IMPLANT
NS IRRIG 1000ML POUR BTL (IV SOLUTION) ×6 IMPLANT
OIL CARTRIDGE MAESTRO DRILL (MISCELLANEOUS) ×3
PACK LAMINECTOMY NEURO (CUSTOM PROCEDURE TRAY) ×3 IMPLANT
RUBBERBAND STERILE (MISCELLANEOUS) IMPLANT
SPONGE SURGIFOAM ABS GEL SZ50 (HEMOSTASIS) ×3 IMPLANT
STRIP CLOSURE SKIN 1/2X4 (GAUZE/BANDAGES/DRESSINGS) ×2 IMPLANT
SUT VIC AB 0 CT1 18XCR BRD8 (SUTURE) ×1 IMPLANT
SUT VIC AB 0 CT1 8-18 (SUTURE) ×3
SUT VIC AB 2-0 CT1 18 (SUTURE) ×3 IMPLANT
SUT VICRYL 4-0 PS2 18IN ABS (SUTURE) ×3 IMPLANT
SYR CONTROL 10ML LL (SYRINGE) ×3 IMPLANT
TOWEL GREEN STERILE (TOWEL DISPOSABLE) ×3 IMPLANT
TOWEL GREEN STERILE FF (TOWEL DISPOSABLE) ×3 IMPLANT
WATER STERILE IRR 1000ML POUR (IV SOLUTION) ×3 IMPLANT

## 2017-11-02 NOTE — Transfer of Care (Signed)
Immediate Anesthesia Transfer of Care Note  Patient: Sandra [name]  Procedure(s) Performed: Laminectomy and Foraminotomy - Lumbar Three-Lumbar Four - Lumbar Four-Lumbar Five (N/A Back)  Patient Location: PACU  Anesthesia Type:General  Level of Consciousness: awake, alert  and oriented  Airway & Oxygen Therapy: Patient Spontanous Breathing and Patient connected to nasal cannula oxygen  Post-op Assessment: Report given to RN, Post -op Vital signs reviewed and stable and Patient moving all extremities  Post vital signs: Reviewed and stable  Last Vitals:  Vitals Value Taken Time  BP 167/74 11/02/2017 10:48 AM  Temp    Pulse 87 11/02/2017 10:52 AM  Resp 18 11/02/2017 10:52 AM  SpO2 97 % 11/02/2017 10:52 AM  Vitals shown include unvalidated device data.  Last Pain:  Vitals:   11/02/17 0709  TempSrc:   PainSc: 0-No pain         Complications: No apparent anesthesia complications

## 2017-11-02 NOTE — H&P (Signed)
Sandra [name] is an 67 y.o. female.   Chief Complaint: Back and right greater than left leg pain HPI: 67 year old female with long-standing back bilateral leg pain worse on the right.  Work-up revealed severe spinal stenosis at L3-4 and L4-5 with a slight grade 1 spondylolisthesis at L4-5.  Due to patient's progressive clinical syndrome imaging findings failed conservative treatment and primarily just leg pain I recommended decompression at L3-4 and L4-5.  I have extensively gone over the risks and benefits of the operation alone with her as well as perioperative course expectations of outcome and alternatives of surgery and she understood and agreed to proceed forward.  Past Medical History:  Diagnosis Date  . Anemia   . Arthritis   . CHF (congestive heart failure) ([place])   . Diabetes mellitus without complication ([place])    type 2  . Family history of adverse reaction to anesthesia    daughter has a hard time waking up  . GERD (gastroesophageal reflux disease)   . Headache    when she first started with HTN  . Heart murmur    has not had any issues  . History of kidney stones   . Hyperlipidemia   . Hyperparathyroidism ([place])   . Hyperplastic colon polyp   . Hypertension   . Irregular heart rate   . Restless legs     Past Surgical History:  Procedure Laterality Date  . COLONOSCOPY    . COLONOSCOPY    . dental procedure     cyst removed from gum  . PARATHYROIDECTOMY Left 12/29/2016   Procedure: LEFT INFERIOR PARATHYROIDECTOMY;  Surgeon: Armandina Gemma, MD;  Location: [facility];  Service: General;  Laterality: Left;  . TUBAL LIGATION      Family History  Problem Relation Age of Onset  . Hypertension Mother   . Alzheimer's disease Mother   . Diabetes Father   . Hypertension Father   . Diabetes Brother   . Heart disease Brother   . Diabetes Brother   . Diabetes Sister   . Heart disease Daughter   . Cancer Neg Hx        no breast or colon cancer  . Colon cancer Neg Hx   .  Esophageal cancer Neg Hx   . Pancreatic cancer Neg Hx   . Rectal cancer Neg Hx   . Stomach cancer Neg Hx    Social History:  reports that she has never smoked. She has never used smokeless tobacco. She reports that she does not drink alcohol or use drugs.  Allergies:  Allergies  Allergen Reactions  . Lisinopril Swelling     Angioedema  . Pravastatin Other (See Comments)    Severe myalgias  . Simvastatin Other (See Comments)    myalgias  . Aspirin Other (See Comments)    Was told to never take,not sure why thought it had something to do with BP    Medications Prior to Admission  Medication Sig Dispense Refill  . gabapentin (NEURONTIN) 100 MG capsule Take 100 mg by mouth 3 (three) times daily.    . metFORMIN (GLUCOPHAGE) 500 MG tablet TAKE ONE TABLET BY MOUTH TWICE A DAY WITH A MEAL (Patient taking differently: Take 500 mg by mouth 2 (two) times daily with a meal. ) 60 tablet 11  . metoprolol succinate (TOPROL-XL) 100 MG 24 hr tablet TAKE ONE TABLET BY MOUTH DAILY WITH OR FOLLOWING A MEAL (Patient taking differently: Take 100 mg by mouth daily. ) 30 tablet 5  .  omeprazole (PRILOSEC) 20 MG capsule TAKE 1 CAPSULE (20 MG TOTAL) BY MOUTH DAILY. 30 capsule 11  . triamterene-hydrochlorothiazide (MAXZIDE-25) 37.5-25 MG tablet TAKE ONE TABLET BY MOUTH DAILY 90 tablet 0  . vitamin B-12 (CYANOCOBALAMIN) 500 MCG tablet Take 500 mcg by mouth daily.    Marland Kitchen atorvastatin (LIPITOR) 20 MG tablet TAKE 1 TABLET (20 MG TOTAL) BY MOUTH DAILY. (Patient not taking: Reported on 10/25/2017) 90 tablet 1  . cyclobenzaprine (FLEXERIL) 5 MG tablet Take 1 5mg  tablet every night shortly before bed, as needed for muscle aches (Patient not taking: Reported on 10/25/2017) 14 tablet 0  . HYDROcodone-acetaminophen (NORCO/VICODIN) 5-325 MG tablet Take 1-2 tablets by mouth every 4 (four) hours as needed for moderate pain. (Patient not taking: Reported on 10/25/2017) 10 tablet 0    Results for orders placed or performed during  the hospital encounter of 11/02/17 (from the past 48 hour(s))  Glucose, capillary     Status: Abnormal   Collection Time: 11/02/17  6:45 AM  Result Value Ref Range   Glucose-Capillary 121 (H) 70 - 99 mg/dL   Comment 1 Notify RN    Comment 2 Document in Chart    No results found.  Review of Systems  Musculoskeletal: Positive for back pain.  Neurological: Positive for tingling and sensory change.    Blood pressure (!) 174/87, pulse 74, temperature 98.3 F (36.8 C), temperature source Oral, resp. rate 20, weight 79.4 kg, SpO2 100 %. Physical Exam  Neurological: She is alert. She has normal strength. GCS eye subscore is 4. GCS verbal subscore is 5. GCS motor subscore is 6.  Patient is awake alert strength is 5 out of 5 iliopsoas, quads, hamstrings, gastroc, and tibialis, EHL.     Assessment/Plan 44-year female presents for decompression L3-4 L4-5  Ashtyn Meland P, MD 11/02/2017, 8:21 AM

## 2017-11-02 NOTE — Anesthesia Preprocedure Evaluation (Signed)
Anesthesia Evaluation  Patient identified by MRN, date of birth, ID band Patient awake    Reviewed: Allergy & Precautions, H&P , NPO status , Patient's Chart, lab work & pertinent test results  Airway Mallampati: II   Neck ROM: full    Dental   Pulmonary    breath sounds clear to auscultation       Cardiovascular hypertension, + Valvular Problems/Murmurs MVP  Rhythm:regular Rate:Normal     Neuro/Psych  Headaches,    GI/Hepatic GERD  ,  Endo/Other  diabetes, Type 2  Renal/GU      Musculoskeletal  (+) Arthritis ,   Abdominal   Peds  Hematology   Anesthesia Other Findings   Reproductive/Obstetrics                             Anesthesia Physical Anesthesia Plan  ASA: II  Anesthesia Plan: General   Post-op Pain Management:    Induction: Intravenous  PONV Risk Score and Plan: 3 and Ondansetron, Dexamethasone, Midazolam and Treatment may vary due to age or medical condition  Airway Management Planned: Oral ETT  Additional Equipment:   Intra-op Plan:   Post-operative Plan: Extubation in OR  Informed Consent: I have reviewed the patients History and Physical, chart, labs and discussed the procedure including the risks, benefits and alternatives for the proposed anesthesia with the patient or authorized representative who has indicated his/her understanding and acceptance.     Plan Discussed with: CRNA, Anesthesiologist and Surgeon  Anesthesia Plan Comments:         Anesthesia Quick Evaluation

## 2017-11-02 NOTE — Evaluation (Signed)
Occupational Therapy Evaluation Patient Details Name: Sandra [name] MRN: [id number] DOB: [birth year] Today's Date: 11/02/2017    History of Present Illness Sandra [name] is a 67yo F who presented with severe spinal stenosis at L3-4 and L4-5 with a slight grade 1 spondylolisthesis at L4-5. and is now s/p L3-5 decompression. No significant PMH.    Clinical Impression   Pt s/p spinal sx and now presenting with deficits in dynamic standing balance, functional mobility, and LB ADLs. Pt edu re adherence to spinal precautions during ADL and IADL tasks, including use of AD. Pt requiring (S) during functional mobility this session, 50 ft. Pt requires no OT follow up but would benefit from 1 more OT session during hospital stay to ensure carryover.     Follow Up Recommendations  No OT follow up    Equipment Recommendations  None recommended by OT    Recommendations for Other Services PT consult     Precautions / Restrictions Precautions Precautions: Back Precaution Comments: Verbally reviewed precautions Restrictions Weight Bearing Restrictions: No      Mobility Bed Mobility Overal bed mobility: Needs Assistance Bed Mobility: Rolling;Sidelying to Sit Rolling: Supervision Sidelying to sit: Supervision       General bed mobility comments: vc for sequencing log rolling technique  Transfers Overall transfer level: Needs assistance Equipment used: None Transfers: Sit to/from Stand;Stand Pivot Transfers Sit to Stand: Supervision Stand pivot transfers: Supervision            Balance Overall balance assessment: Needs assistance Sitting-balance support: Feet supported;No upper extremity supported Sitting balance-Leahy Scale: Good     Standing balance support: No upper extremity supported;During functional activity Standing balance-Leahy Scale: Fair                             ADL either performed or assessed with clinical judgement   ADL Overall ADL's : Needs  assistance/impaired Eating/Feeding: Modified independent   Grooming: Wash/dry hands;Standing;Supervision/safety   Upper Body Bathing: Modified independent   Lower Body Bathing: Minimal assistance;Sit to/from stand;Adhering to back precautions   Upper Body Dressing : Modified independent   Lower Body Dressing: Minimal assistance;Cueing for back precautions;Sit to/from stand Lower Body Dressing Details (indicate cue type and reason): edu provided re AD use in LB dressing, i.e. reacher Toilet Transfer: Supervision/safety;Ambulation   Toileting- Clothing Manipulation and Hygiene: Supervision/safety;Sit to/from stand(increased time)               Vision Baseline Vision/History: Wears glasses Wears Glasses: At all times Patient Visual Report: No change from baseline Vision Assessment?: No apparent visual deficits     Perception     Praxis      Pertinent Vitals/Pain Pain Assessment: 0-10 Pain Score: 7  Pain Location: back Pain Descriptors / Indicators: Aching Pain Intervention(s): Monitored during session     Hand Dominance Right   Extremity/Trunk Assessment Upper Extremity Assessment Upper Extremity Assessment: Overall WFL for tasks assessed   Lower Extremity Assessment Lower Extremity Assessment: Defer to PT evaluation   Cervical / Trunk Assessment Cervical / Trunk Assessment: Other exceptions Cervical / Trunk Exceptions: s/p spinal sx   Communication Communication Communication: No difficulties   Cognition Arousal/Alertness: Awake/alert Behavior During Therapy: WFL for tasks assessed/performed Overall Cognitive Status: Within Functional Limits for tasks assessed  General Comments       Exercises     Shoulder Instructions      Home Living Family/patient expects to be discharged to:: Private residence Living Arrangements: Spouse/significant other Available Help at Discharge: Family Type of Home:  House Home Access: Level entry     Home Layout: Two level Alternate Level Stairs-Number of Steps: 12   Bathroom Shower/Tub: Teacher, early years/pre: Standard Bathroom Accessibility: No   Home Equipment: None          Prior Functioning/Environment Level of Independence: Independent        Comments: Less independece as back pain became worse, but I overall        OT Problem List: Decreased range of motion;Impaired balance (sitting and/or standing);Pain;Decreased safety awareness;Decreased knowledge of use of DME or AE;Decreased knowledge of precautions      OT Treatment/Interventions: Self-care/ADL training;Therapeutic exercise;Energy conservation;DME and/or AE instruction;Therapeutic activities;Balance training;Patient/family education    OT Goals(Current goals can be found in the care plan section) Acute Rehab OT Goals Patient Stated Goal: "get rid of this pain" OT Goal Formulation: With patient Time For Goal Achievement: 11/09/17 Potential to Achieve Goals: Good  OT Frequency: Min 2X/week   Barriers to D/C:            Co-evaluation              AM-PAC PT "6 Clicks" Daily Activity     Outcome Measure Help from another person eating meals?: None Help from another person taking care of personal grooming?: None Help from another person toileting, which includes using toliet, bedpan, or urinal?: A Little Help from another person bathing (including washing, rinsing, drying)?: A Little Help from another person to put on and taking off regular upper body clothing?: None Help from another person to put on and taking off regular lower body clothing?: A Little 6 Click Score: 21   End of Session Equipment Utilized During Treatment: Gait belt Nurse Communication: Mobility status  Activity Tolerance: Patient tolerated treatment well Patient left: in bed;with family/visitor present  OT Visit Diagnosis: Unsteadiness on feet (R26.81)                Time:  0300-9233 OT Time Calculation (min): 17 min Charges:  OT General Charges $OT Visit: 1 Visit OT Evaluation $OT Eval Low Complexity: 1 Low  Curtis Sites OTR/L 11/02/2017, 1:35 PM

## 2017-11-02 NOTE — Anesthesia Procedure Notes (Signed)
Procedure Name: Intubation Date/Time: 11/02/2017 8:39 AM Performed by: Shanen Norris T, CRNA Pre-anesthesia Checklist: Patient identified, Emergency Drugs available, Suction available and Patient being monitored Patient Re-evaluated:Patient Re-evaluated prior to induction Oxygen Delivery Method: Circle system utilized Preoxygenation: Pre-oxygenation with 100% oxygen Induction Type: IV induction Ventilation: Mask ventilation without difficulty Laryngoscope Size: Miller and 2 Grade View: Grade II Tube type: Oral Tube size: 7.5 mm Number of attempts: 1 Airway Equipment and Method: Patient positioned with wedge pillow and Stylet Placement Confirmation: ETT inserted through vocal cords under direct vision,  positive ETCO2 and breath sounds checked- equal and bilateral Secured at: 22 cm Tube secured with: Tape Dental Injury: Teeth and Oropharynx as per pre-operative assessment

## 2017-11-02 NOTE — Evaluation (Signed)
Physical Therapy Evaluation Patient Details Name: Sandra [name] MRN: [id number] DOB: 1950-12-23 Today's Date: 11/02/2017   History of Present Illness  Pt is a 67 y/o female s/p L3-5 laminectomy. PMH includes a CHF, HTN, and DM.   Clinical Impression  Patient is s/p above surgery resulting in the deficits listed below (see PT Problem List). Pt very guarded during gait, however, reports increased comfort with use of RW. Required min guard A for mobility this session. Educated about precautions and generalized walking program. Patient will benefit from skilled PT to increase their independence and safety with mobility (while adhering to their precautions) to allow discharge to the venue listed below.     Follow Up Recommendations No PT follow up    Equipment Recommendations  Rolling walker with 5" wheels;3in1 (PT)    Recommendations for Other Services       Precautions / Restrictions Precautions Precautions: Back Precaution Booklet Issued: Yes (comment) Precaution Comments: Reviewed back precautions with pt.  Restrictions Weight Bearing Restrictions: No      Mobility  Bed Mobility Overal bed mobility: Needs Assistance Bed Mobility: Rolling;Sidelying to Sit;Sit to Sidelying Rolling: Supervision Sidelying to sit: Supervision     Sit to sidelying: Supervision General bed mobility comments: Verbal cues for sequencing log rolling technique  Transfers Overall transfer level: Needs assistance Equipment used: Rolling walker (2 wheeled) Transfers: Sit to/from [facility] Sit to Stand: Min guard         General transfer comment: Min guard for safety. Verbal cues for hand placement.   Ambulation/Gait Ambulation/Gait assistance: Min guard Gait Distance (Feet): 150 Feet Assistive device: Rolling walker (2 wheeled) Gait Pattern/deviations: Step-through pattern;Decreased stride length Gait velocity: Decreased   General Gait Details: Very slow, very guarded  gait. Reports increased comfort with use of RW. Educated about generalized walking program to perform at home.   Stairs            Wheelchair Mobility    Modified Rankin (Stroke Patients Only)       Balance Overall balance assessment: Needs assistance Sitting-balance support: Feet supported;No upper extremity supported Sitting balance-Leahy Scale: Good     Standing balance support: During functional activity;Bilateral upper extremity supported Standing balance-Leahy Scale: Poor Standing balance comment: Reliant on BUE support.                              Pertinent Vitals/Pain Pain Assessment: Faces Faces Pain Scale: Hurts even more Pain Location: back Pain Descriptors / Indicators: Aching Pain Intervention(s): Limited activity within patient's tolerance;Monitored during session;Repositioned    Home Living Family/patient expects to be discharged to:: Private residence Living Arrangements: Spouse/significant other Available Help at Discharge: Family Type of Home: House Home Access: Level entry     Home Layout: Two level;Able to live on main level with bedroom/bathroom Home Equipment: None      Prior Function Level of Independence: Independent         Comments: Less independece as back pain became worse, but I overall     Hand Dominance   Dominant Hand: Right    Extremity/Trunk Assessment   Upper Extremity Assessment Upper Extremity Assessment: Defer to OT evaluation    Lower Extremity Assessment Lower Extremity Assessment: Generalized weakness    Cervical / Trunk Assessment Cervical / Trunk Assessment: Other exceptions Cervical / Trunk Exceptions: s/p spinal sx  Communication   Communication: No difficulties  Cognition Arousal/Alertness: Awake/alert Behavior During Therapy: WFL for tasks  assessed/performed Overall Cognitive Status: Within Functional Limits for tasks assessed                                         General Comments      Exercises     Assessment/Plan    PT Assessment Patient needs continued PT services  PT Problem List Decreased strength;Decreased mobility;Decreased activity tolerance;Decreased knowledge of use of DME;Decreased knowledge of precautions;Pain       PT Treatment Interventions DME instruction;Gait training;Therapeutic activities;Functional mobility training;Therapeutic exercise;Balance training;Patient/family education    PT Goals (Current goals can be found in the Care Plan section)  Acute Rehab PT Goals Patient Stated Goal: "get rid of this pain" PT Goal Formulation: With patient Time For Goal Achievement: 11/16/17 Potential to Achieve Goals: Good    Frequency Min 5X/week   Barriers to discharge        Co-evaluation               AM-PAC PT "6 Clicks" Daily Activity  Outcome Measure Difficulty turning over in bed (including adjusting bedclothes, sheets and blankets)?: A Little Difficulty moving from lying on back to sitting on the side of the bed? : A Little Difficulty sitting down on and standing up from a chair with arms (e.g., wheelchair, bedside commode, etc,.)?: Unable Help needed moving to and from a bed to chair (including a wheelchair)?: A Little Help needed walking in hospital room?: A Little Help needed climbing 3-5 steps with a railing? : A Lot 6 Click Score: 15    End of Session Equipment Utilized During Treatment: Gait belt Activity Tolerance: Patient tolerated treatment well Patient left: in bed;with call bell/phone within reach Nurse Communication: Mobility status PT Visit Diagnosis: Other abnormalities of gait and mobility (R26.89);Muscle weakness (generalized) (M62.81);Pain Pain - part of body: (back )    Time: 6962-9528 PT Time Calculation (min) (ACUTE ONLY): 19 min   Charges:   PT Evaluation $PT Eval Low Complexity: 1 Low          Leighton Ruff, PT, DPT  Acute Rehabilitation Services  Pager:  [phone]   Rudean Hitt 11/02/2017, 6:22 PM

## 2017-11-02 NOTE — Anesthesia Postprocedure Evaluation (Signed)
Anesthesia Post Note  Patient: Sandra [name]  Procedure(s) Performed: Laminectomy and Foraminotomy - Lumbar Three-Lumbar Four - Lumbar Four-Lumbar Five (N/A Back)     Patient location during evaluation: PACU Anesthesia Type: General Level of consciousness: awake and alert Pain management: pain level controlled Vital Signs Assessment: post-procedure vital signs reviewed and stable Respiratory status: spontaneous breathing, nonlabored ventilation, respiratory function stable and patient connected to nasal cannula oxygen Cardiovascular status: blood pressure returned to baseline and stable Postop Assessment: no apparent nausea or vomiting Anesthetic complications: no    Last Vitals:  Vitals:   11/02/17 1049 11/02/17 1103  BP:  (!) 150/72  Pulse: 85 75  Resp: 19 15  Temp:    SpO2: 99% 97%    Last Pain:  Vitals:   11/02/17 1120  TempSrc:   PainSc: 0-No pain                 [name] S

## 2017-11-02 NOTE — Social Work (Signed)
CSW acknowledging consult for SNF placement, will follow for postoperative therapy recommendations.  Alexander Mt, [place] Work [phone]

## 2017-11-02 NOTE — Op Note (Signed)
Reoperative diagnosis: Lumbar spinal stenosis L3-4 lumbar spinal stenosis L4-5 bilateral L3 radiculopathies and L4 radiculopathies right-sided L5 radiculopathy with instability L4-5  Postoperative diagnosis: Same  Procedure: #1 bilateral decompressive laminotomy with partial medial facetectomies and foraminotomies of the L3 and L4 nerve roots at L3-4.  2.  Right-sided decompressive laminectomy L4-5 with foraminotomy of the right L5 nerve root and partial medial facetectomy  3.  In situ onlay fusion L4-5 on the left utilizing locally harvested autograft mixed with vivigen  Surgeon: Kary Kos  Assistant: Glenford Peers of anesthesia: General  EBL: Minimal  HPI: 67 year old female with long-standing back and bilateral hip and leg pain but the left leg primarily had pain around L3 on the left the right leg would go down to her foot consistent with L5.  Work-up revealed severe spinal stenosis at L3-4 and L4-5 with a slight listhesis and motion of flex ex at L4-5.  However patient had no back pain entirely claudication with radicular pain so I recommended decompression alone with the possibility depending on level of instability of in situ or only fusion.  Extensively the risks and benefits of the operation with the patient as well as perioperative course expectations of outcome and alternatives of surgery and she understands and agrees to proceed forward.  Patient brought into the ER was used under general anesthesia positioned prone on the Wilson frame the back was prepped and draped in routine sterile fashion.  Preoperative X localize the appropriate level so after infiltration of 10 cc lidocaine with epi midline incision made over the car was used to take down the subcu tissue and subperiosteal dissection was carried lamina of L3-L4 and L5 bilaterally.  The facets at L3-4 were partially diastased study with large synovial cyst on the outside of facet joints as well as a same was true at L4-5.  After  adequate exposure been achieved to remove the spinous process at L3 confirmed with the level with intraoperative x-ray.  Then remove the spinous process at L3 performed a central decompression was marked stenosis primarily at the L3-4 disc space this was all removed by under biting the medial facet complex preserving as much integrity of the joint as I could.  I performed foraminotomies of the L3 and L4 nerve roots bilaterally.  I then drilled on the lamina on the right at L4-5 performed to complete decompressive laminectomy on the right at L4-5 with marked stenosis again at the level to 4 5 interspace.  At the end decompression was no further stenosis either centrally or foraminally wound scopes irrigated [name] states was maintained then due to the instability of motion L4-5 I elected to lay an onlay in situ fusion on the left along the facet joints and lamina at L4-5.  The locally harvested graft was mixed with revision packed in the partially diastase facet joint after aggressive decortication of the lamina at L4 and L5 as well as medial facet joint.  Then placed a medium Hemovac drain closed wound in layers with inverted Vicryl running 4 subcuticular and skin dermal benzoin Steri-Strips and a sterile dressing was applied and patient went to recovery room in stable condition.  At the end the case on the account sponge counts were correct.

## 2017-11-03 DIAGNOSIS — M1990 Unspecified osteoarthritis, unspecified site: Secondary | ICD-10-CM | POA: Diagnosis not present

## 2017-11-03 DIAGNOSIS — Z886 Allergy status to analgesic agent status: Secondary | ICD-10-CM | POA: Diagnosis not present

## 2017-11-03 DIAGNOSIS — Z79899 Other long term (current) drug therapy: Secondary | ICD-10-CM | POA: Diagnosis not present

## 2017-11-03 DIAGNOSIS — G2581 Restless legs syndrome: Secondary | ICD-10-CM | POA: Diagnosis not present

## 2017-11-03 DIAGNOSIS — K219 Gastro-esophageal reflux disease without esophagitis: Secondary | ICD-10-CM | POA: Diagnosis not present

## 2017-11-03 DIAGNOSIS — M5416 Radiculopathy, lumbar region: Secondary | ICD-10-CM | POA: Diagnosis not present

## 2017-11-03 DIAGNOSIS — R011 Cardiac murmur, unspecified: Secondary | ICD-10-CM | POA: Diagnosis not present

## 2017-11-03 DIAGNOSIS — Z7984 Long term (current) use of oral hypoglycemic drugs: Secondary | ICD-10-CM | POA: Diagnosis not present

## 2017-11-03 DIAGNOSIS — I341 Nonrheumatic mitral (valve) prolapse: Secondary | ICD-10-CM | POA: Diagnosis not present

## 2017-11-03 DIAGNOSIS — G9619 Other disorders of meninges, not elsewhere classified: Secondary | ICD-10-CM | POA: Diagnosis not present

## 2017-11-03 DIAGNOSIS — E785 Hyperlipidemia, unspecified: Secondary | ICD-10-CM | POA: Diagnosis not present

## 2017-11-03 DIAGNOSIS — Z8249 Family history of ischemic heart disease and other diseases of the circulatory system: Secondary | ICD-10-CM | POA: Diagnosis not present

## 2017-11-03 DIAGNOSIS — M532X6 Spinal instabilities, lumbar region: Secondary | ICD-10-CM | POA: Diagnosis not present

## 2017-11-03 DIAGNOSIS — I509 Heart failure, unspecified: Secondary | ICD-10-CM | POA: Diagnosis not present

## 2017-11-03 DIAGNOSIS — E119 Type 2 diabetes mellitus without complications: Secondary | ICD-10-CM | POA: Diagnosis not present

## 2017-11-03 DIAGNOSIS — Z888 Allergy status to other drugs, medicaments and biological substances status: Secondary | ICD-10-CM | POA: Diagnosis not present

## 2017-11-03 DIAGNOSIS — M48061 Spinal stenosis, lumbar region without neurogenic claudication: Secondary | ICD-10-CM | POA: Diagnosis not present

## 2017-11-03 DIAGNOSIS — I110 Hypertensive heart disease with heart failure: Secondary | ICD-10-CM | POA: Diagnosis not present

## 2017-11-03 LAB — GLUCOSE, CAPILLARY: GLUCOSE-CAPILLARY: 125 mg/dL — AB (ref 70–99)

## 2017-11-03 MED ORDER — CYCLOBENZAPRINE HCL 10 MG PO TABS: 10.0000 mg | ORAL_TABLET | Freq: Three times a day (TID) | ORAL | 0 refills | Status: DC | PRN

## 2017-11-03 MED ORDER — HYDROCODONE-ACETAMINOPHEN 5-325 MG PO TABS: 1.0000 | ORAL_TABLET | ORAL | 0 refills | Status: DC | PRN

## 2017-11-03 NOTE — Progress Notes (Signed)
Occupational Therapy Treatment Patient Details Name: Sandra [name] MRN: [id number] DOB: 09-Apr-1950 Today's Date: 11/03/2017    History of present illness (P) Pt is a 67 y/o female s/p L3-5 laminectomy. PMH includes a CHF, HTN, and DM.    OT comments  This 67 yo female admitted with above seen today for treatment to wrap up education on basic ADLs as it pertains to following back precautions. All education completed we will sign off.  Follow Up Recommendations  No OT follow up;Supervision - Intermittent    Equipment Recommendations  3 in 1 bedside commode       Precautions / Restrictions Precautions Precautions: (P) Back Precaution Booklet Issued: (P) Yes (comment) Precaution Comments: (P) Reviewed back precautions with pt.  Restrictions Weight Bearing Restrictions: (P) No       Mobility Bed Mobility Overal bed mobility: (P) Needs Assistance Bed Mobility: (P) Rolling;Sidelying to Sit;Sit to Sidelying Rolling: (P) Supervision Sidelying to sit: (P) Supervision     Sit to sidelying: (P) Supervision General bed mobility comments: Pt up in recliner upon arrival  Transfers Overall transfer level: Needs assistance Equipment used: Rolling walker (2 wheeled) Transfers: Sit to/from [facility] Sit to Stand: Supervision Stand pivot transfers: Supervision       General transfer comment:  Verbal cues for hand placement.     Balance Overall balance assessment: (P) Needs assistance Sitting-balance support: (P) Feet supported;No upper extremity supported Sitting balance-Leahy Scale: (P) Good     Standing balance support: (P) During functional activity;Bilateral upper extremity supported Standing balance-Leahy Scale: (P) Poor Standing balance comment: (P) Reliant on BUE support.                            ADL either performed or assessed with clinical judgement   ADL                                         General ADL Comments:  Pt reports husband will A her in her LBADLs that she cannot do right now due to back precautions. She also reports she will only do sponge baths right now since she does not like showers (normally does tub baths). Educated pt on using 2 cups for brushing teeth, using wet wipes for back peri care, leaning to side to wipe and not twisting around.               Cognition Arousal/Alertness: Awake/alert Behavior During Therapy: WFL for tasks assessed/performed Overall Cognitive Status: Within Functional Limits for tasks assessed                                                     Pertinent Vitals/ Pain       Pain Assessment: 0-10 Pain Score: 3  Faces Pain Scale: (P) Hurts even more Pain Location: back Pain Descriptors / Indicators: Operative site guarding;Sore Pain Intervention(s): Limited activity within patient's tolerance;Monitored during session     Prior Functioning/Environment              Frequency  Min 2X/week        Progress Toward Goals  OT Goals(current goals can now be found in the care plan section)  Progress towards  OT goals: (All education completed and pt to D/C today)  Acute Rehab OT Goals Patient Stated Goal: (P) "get rid of this pain"  Plan Discharge plan remains appropriate       AM-PAC PT "6 Clicks" Daily Activity     Outcome Measure   Help from another person eating meals?: None Help from another person taking care of personal grooming?: None Help from another person toileting, which includes using toliet, bedpan, or urinal?: A Little Help from another person bathing (including washing, rinsing, drying)?: A Little Help from another person to put on and taking off regular upper body clothing?: None Help from another person to put on and taking off regular lower body clothing?: A Lot 6 Click Score: 20    End of Session Equipment Utilized During Treatment: Rolling walker  OT Visit Diagnosis: Unsteadiness on feet  (R26.81);Pain Pain - part of body: (back)   Activity Tolerance Patient tolerated treatment well   Patient Left in bed;with call bell/phone within reach   Nurse Communication          Time: 8185-6314 OT Time Calculation (min): 16 min  Charges: OT General Charges $OT Visit: 1 Visit OT Treatments $Self Care/Home Management : 8-22 mins  [place] Circle, OTR/L [phone] 11/03/2017

## 2017-11-03 NOTE — Discharge Summary (Signed)
Physician Discharge Summary  Patient ID: Sandra [name] MRN: [id number] DOB/AGE: [birth year] 67 y.o.  Admit date: 11/02/2017 Discharge date: 11/03/2017  Admission Diagnoses: Lumbar spinal stenosis L3-4 lumbar spinal stenosis L4-5 bilateral L3 radiculopathies and L4 radiculopathies right-sided L5 radiculopathy with instability L4-5  Discharge Diagnoses: same   Discharged Condition: good  Hospital Course: The patient was admitted on 11/02/2017 and taken to the operating room where the patient underwent decompressive lami l4-5, in situ fusion l4-5. The patient tolerated the procedure well and was taken to the recovery room and then to the floor in stable condition. The hospital course was routine. There were no complications. The wound remained clean dry and intact. Pt had appropriate back soreness. No complaints of leg pain or new N/T/W. The patient remained afebrile with stable vital signs, and tolerated a regular diet. The patient continued to increase activities, and pain was well controlled with oral pain medications.   Consults: None  Significant Diagnostic Studies:  Results for orders placed or performed during the hospital encounter of 11/02/17  Glucose, capillary  Result Value Ref Range   Glucose-Capillary 121 (H) 70 - 99 mg/dL   Comment 1 Notify RN    Comment 2 Document in Chart   Glucose, capillary  Result Value Ref Range   Glucose-Capillary 129 (H) 70 - 99 mg/dL  Glucose, capillary  Result Value Ref Range   Glucose-Capillary 254 (H) 70 - 99 mg/dL  Glucose, capillary  Result Value Ref Range   Glucose-Capillary 209 (H) 70 - 99 mg/dL   Comment 1 Notify RN    Comment 2 Document in Chart   Glucose, capillary  Result Value Ref Range   Glucose-Capillary 125 (H) 70 - 99 mg/dL   Comment 1 Notify RN    Comment 2 Document in Chart     Dg Lumbar Spine 2-3 Views  Result Date: 11/02/2017 CLINICAL DATA:  Intraoperative localization EXAM: LUMBAR SPINE - 2-3 VIEW COMPARISON:   10/11/2016 FINDINGS: First lateral intraoperative image demonstrates a posterior needle directed at the superior aspect of the L4 spinous process. Second lateral intraoperative image demonstrates posterior surgical instruments directed at L3-4. IMPRESSION: Intraoperative localization as above. Electronically Signed   By: Rolm Baptise M.D.   On: 11/02/2017 09:28    Antibiotics:  Anti-infectives (From admission, onward)   Start     Dose/Rate Route Frequency Ordered Stop   11/02/17 1600  ceFAZolin (ANCEF) IVPB 2g/100 mL premix     2 g 200 mL/hr over 30 Minutes Intravenous Every 8 hours 11/02/17 1057 11/03/17 0036   11/02/17 0859  bacitracin 50,000 Units in sodium chloride 0.9 % 500 mL irrigation  Status:  Discontinued       As needed 11/02/17 0900 11/02/17 1218   11/02/17 0700  ceFAZolin (ANCEF) IVPB 2g/100 mL premix     2 g 200 mL/hr over 30 Minutes Intravenous To Surgery 11/02/17 0643 11/02/17 0855      Discharge Exam: Blood pressure 136/70, pulse 67, temperature 98.8 F (37.1 C), temperature source Oral, resp. rate 18, weight 79.4 kg, SpO2 96 %. Neurologic: Grossly normal Ambulating and voiding well  Discharge Medications:   Allergies as of 11/03/2017      Reactions   Lisinopril Swelling    Angioedema   Pravastatin Other (See Comments)   Severe myalgias   Simvastatin Other (See Comments)   myalgias   Aspirin Other (See Comments)   Was told to never take,not sure why thought it had something to do with BP  Medication List    STOP taking these medications   atorvastatin 20 MG tablet Commonly known as:  LIPITOR     TAKE these medications   cyclobenzaprine 10 MG tablet Commonly known as:  FLEXERIL Take 1 tablet (10 mg total) by mouth 3 (three) times daily as needed for muscle spasms. What changed:    medication strength  how much to take  how to take this  when to take this  reasons to take this  additional instructions   gabapentin 100 MG capsule Commonly  known as:  NEURONTIN Take 100 mg by mouth 3 (three) times daily.   HYDROcodone-acetaminophen 5-325 MG tablet Commonly known as:  NORCO/VICODIN Take 1 tablet by mouth every 4 (four) hours as needed for moderate pain. What changed:  how much to take   metFORMIN 500 MG tablet Commonly known as:  GLUCOPHAGE TAKE ONE TABLET BY MOUTH TWICE A DAY WITH A MEAL What changed:  See the new instructions.   metoprolol succinate 100 MG 24 hr tablet Commonly known as:  TOPROL-XL TAKE ONE TABLET BY MOUTH DAILY WITH OR FOLLOWING A MEAL What changed:  See the new instructions.   omeprazole 20 MG capsule Commonly known as:  PRILOSEC TAKE 1 CAPSULE (20 MG TOTAL) BY MOUTH DAILY.   triamterene-hydrochlorothiazide 37.5-25 MG tablet Commonly known as:  MAXZIDE-25 TAKE ONE TABLET BY MOUTH DAILY   vitamin B-12 500 MCG tablet Commonly known as:  CYANOCOBALAMIN Take 500 mcg by mouth daily.       Disposition: home   Final Dx: lami and in situ fusion l4-5  Discharge Instructions     Remove dressing in 72 hours   Complete by:  As directed    Call MD for:  difficulty breathing, headache or visual disturbances   Complete by:  As directed    Call MD for:  hives   Complete by:  As directed    Call MD for:  persistant dizziness or light-headedness   Complete by:  As directed    Call MD for:  persistant nausea and vomiting   Complete by:  As directed    Call MD for:  redness, tenderness, or signs of infection (pain, swelling, redness, odor or green/yellow discharge around incision site)   Complete by:  As directed    Call MD for:  severe uncontrolled pain   Complete by:  As directed    Call MD for:  temperature >100.4   Complete by:  As directed    Diet - low sodium heart healthy   Complete by:  As directed    Driving Restrictions   Complete by:  As directed    No driving 2 weeks   Increase activity slowly   Complete by:  As directed    Lifting restrictions   Complete by:  As directed     Nothing heavier than 8 lbs         Signed: Ocie Cornfield Jontae Adebayo 11/03/2017, 8:25 AM

## 2017-11-03 NOTE — Progress Notes (Signed)
Patient alert and oriented, mae's well, voiding adequate amount of urine, swallowing without difficulty, no c/o pain at time of discharge. Patient discharged home with family. Script and discharged instructions given to patient. Patient and family stated understanding of instructions given. Patient has an appointment with Dr. Cram 

## 2017-11-03 NOTE — Progress Notes (Signed)
Physical Therapy Progress Note  Assessment: Pt progressing well towards physical therapy goals. Was able to perform transfers with mod I and ambulation with supervision progressing to mod I by end of session. DME present in room for d/c and RW adjusted to proper height for pt. Pt anticipates d/c home today. Will continue to follow and progress as able per POC.    11/03/17 0903  PT Visit Information  Last PT Received On 11/03/17  Assistance Needed +1  History of Present Illness Pt is a 67 y/o female s/p L3-5 laminectomy. PMH includes a CHF, HTN, and DM.   Subjective Data  Patient Stated Goal "get rid of this pain"  Precautions  Precautions Back  Precaution Booklet Issued Yes (comment)  Precaution Comments Reviewed back precautions with pt.   Restrictions  Weight Bearing Restrictions No  Pain Assessment  Pain Assessment Faces  Faces Pain Scale 4  Pain Location back  Pain Descriptors / Indicators Aching  Pain Intervention(s) Monitored during session;Repositioned  Cognition  Arousal/Alertness Awake/alert  Behavior During Therapy WFL for tasks assessed/performed  Overall Cognitive Status Within Functional Limits for tasks assessed  Bed Mobility  General bed mobility comments Pt sitting up EOB when PT arrived.  Transfers  Overall transfer level Needs assistance  Equipment used Rolling walker (2 wheeled)  Transfers Sit to/from Stand  Sit to Stand Modified independent (Device/Increase time)  General transfer comment Increased time however no assistance required to power-up to full stand. VC's for safe hand placement on seated surface for safety  Ambulation/Gait  Ambulation/Gait assistance Supervision;Modified independent (Device/Increase time)  Gait Distance (Feet) 250 Feet  Assistive device Rolling walker (2 wheeled)  Gait Pattern/deviations Step-through pattern;Decreased stride length  General Gait Details Slow and guarded but overall ambulating well without assistance. By end of  session pt mod I in the room with RW for support.   Gait velocity Decreased  Gait velocity interpretation <1.31 ft/sec, indicative of household ambulator  Balance  Overall balance assessment Needs assistance  Sitting-balance support Feet supported;No upper extremity supported  Sitting balance-Leahy Scale Good  Standing balance support During functional activity;Bilateral upper extremity supported  Standing balance-Leahy Scale Poor  Standing balance comment Reliant on BUE support.   PT - End of Session  Equipment Utilized During Treatment Gait belt  Activity Tolerance Patient tolerated treatment well  Patient left in bed;with call bell/phone within reach  Nurse Communication Mobility status   PT - Assessment/Plan  PT Plan Current plan remains appropriate  PT Visit Diagnosis Other abnormalities of gait and mobility (R26.89);Muscle weakness (generalized) (M62.81);Pain  Pain - part of body  (back )  PT Frequency (ACUTE ONLY) Min 5X/week  Follow Up Recommendations No PT follow up  PT equipment Rolling walker with 5" wheels;3in1 (PT)  AM-PAC PT "6 Clicks" Daily Activity Outcome Measure  Difficulty turning over in bed (including adjusting bedclothes, sheets and blankets)? 4  Difficulty moving from lying on back to sitting on the side of the bed?  3  Difficulty sitting down on and standing up from a chair with arms (e.g., wheelchair, bedside commode, etc,.)? 4  Help needed moving to and from a bed to chair (including a wheelchair)? 4  Help needed walking in hospital room? 4  Help needed climbing 3-5 steps with a railing?  3  6 Click Score 22  Mobility G Code  CJ  PT Goal Progression  Progress towards PT goals Progressing toward goals  Acute Rehab PT Goals  PT Goal Formulation With patient  Time For  Goal Achievement 11/16/17  Potential to Achieve Goals Good  PT Time Calculation  PT Start Time (ACUTE ONLY) 0754  PT Stop Time (ACUTE ONLY) 0815  PT Time Calculation (min) (ACUTE ONLY) 21  min  PT General Charges  $$ ACUTE PT VISIT 1 Visit  PT Treatments  $Gait Training 8-22 mins   Rolinda Roan, PT, DPT Acute Rehabilitation Services Pager: [phone]

## 2017-11-05 ENCOUNTER — Encounter (HOSPITAL_COMMUNITY): Payer: Self-pay | Admitting: Neurosurgery

## 2017-11-26 ENCOUNTER — Other Ambulatory Visit: Payer: Self-pay | Admitting: Internal Medicine

## 2017-11-27 ENCOUNTER — Other Ambulatory Visit: Payer: Self-pay | Admitting: Internal Medicine

## 2017-12-03 ENCOUNTER — Other Ambulatory Visit: Payer: Self-pay

## 2017-12-03 MED ORDER — METOPROLOL SUCCINATE ER 100 MG PO TB24: ORAL_TABLET | ORAL | 5 refills | Status: DC

## 2017-12-03 MED ORDER — TRIAMTERENE-HCTZ 37.5-25 MG PO TABS: 1.0000 | ORAL_TABLET | Freq: Every day | ORAL | 0 refills | Status: DC

## 2017-12-11 ENCOUNTER — Other Ambulatory Visit: Payer: Self-pay | Admitting: Internal Medicine

## 2017-12-24 ENCOUNTER — Other Ambulatory Visit: Payer: Self-pay | Admitting: Internal Medicine

## 2017-12-28 ENCOUNTER — Ambulatory Visit (INDEPENDENT_AMBULATORY_CARE_PROVIDER_SITE_OTHER): Payer: Medicare Other | Admitting: Family Medicine

## 2017-12-28 ENCOUNTER — Other Ambulatory Visit: Payer: Self-pay

## 2017-12-28 ENCOUNTER — Encounter: Payer: Self-pay | Admitting: Family Medicine

## 2017-12-28 VITALS — BP 148/80 | HR 80 | Temp 97.9°F | Ht 63.0 in | Wt 170.0 lb

## 2017-12-28 DIAGNOSIS — I10 Essential (primary) hypertension: Secondary | ICD-10-CM | POA: Diagnosis not present

## 2017-12-28 DIAGNOSIS — Z13820 Encounter for screening for osteoporosis: Secondary | ICD-10-CM | POA: Diagnosis not present

## 2017-12-28 DIAGNOSIS — Z1159 Encounter for screening for other viral diseases: Secondary | ICD-10-CM | POA: Diagnosis not present

## 2017-12-28 DIAGNOSIS — R1013 Epigastric pain: Secondary | ICD-10-CM | POA: Diagnosis not present

## 2017-12-28 DIAGNOSIS — Z23 Encounter for immunization: Secondary | ICD-10-CM | POA: Diagnosis not present

## 2017-12-28 DIAGNOSIS — E119 Type 2 diabetes mellitus without complications: Secondary | ICD-10-CM

## 2017-12-28 MED ORDER — FAMOTIDINE 20 MG PO TABS: 20.0000 mg | ORAL_TABLET | Freq: Two times a day (BID) | ORAL | 3 refills | Status: DC

## 2017-12-28 NOTE — Patient Instructions (Signed)
It was wonderful to see you today.  Thank you for choosing [facility].   Please call [phone] with any questions about today's appointment.  Please be sure to schedule follow up at the front  desk before you leave today.   Schedule follow up in 3 months with Dr. Julieta Gutting, MD  Family Medicine

## 2017-12-28 NOTE — Progress Notes (Signed)
  Patient Name: LAQUITA HARLAN Date of Birth: [birth year] Date of Visit: 12/28/17 PCP: Guadalupe Dawn, MD  Chief Complaint: check in for mediations   Subjective: FLORDIA KASSEM is a pleasant 67 y.o. year old with history of very well-controlled type 2 diabetes on metformin therapy, hypertension, and acid reflux presented today for a refill on her proton pump inhibitor.  She reports a long-standing history of acid reflux.  She denies dysphasia, odynophagia, melena, hematochezia, vomiting or nausea.  She reports her heartburn is mild.  She has been off her PPI therapy for 2 weeks.  She is not aware of any of the side effects of long-term PPI use.  The patient reports she has been taking her metformin as prescribed. She denies polyuria or polydipsia. A1C have all been well controlled.   The patient recently had a lumbar fusion, reports improvement in her back pain.   ROS:  ROS Negative except for as above I have reviewed the patient's medical, surgical, family, and social history as appropriate.   Vitals:   12/28/17 1027  BP: (!) 148/80  Pulse: 80  Temp: 97.9 F (36.6 C)  SpO2: 97%   Filed Weights   12/28/17 1027  Weight: 170 lb (77.1 kg)   HEENT: Sclera anicteric. Dentition is moderate. Appears well hydrated. Neck: Supple Cardiac: Regular rate and rhythm. Normal S1/S2. No murmurs, rubs, or gallops appreciated. Lungs: Clear bilaterally to ascultation.  Abd: Soft, no epigastic tendernes   Imojean was seen today for medication check.  Diagnoses and all orders for this visit:  Dyspepsia without red flags.  Given long-standing history could consider EGD in the future.  We discussed that there is some association with PPI therapy and increased risk of C. difficile, pneumonia and osteoporosis.  Recent studies have disputed some of these findings.  We discussed the risks and potential benefits of the medication.  She is amenable to trial an H2 blocker instead -     famotidine (PEPCID) 20  MG tablet; Take 1 tablet (20 mg total) by mouth 2 (two) times daily.  Screening for osteoporosis -     DG Bone Density; Future  Encounter for hepatitis C screening test for low risk patient -     Hepatitis c antibody (reflex)  Essential hypertension -     Basic Metabolic Panel  Well controlled type 2 diabetes mellitus ([place]) -     Lipid Panel  Need for immunization against influenza -     Flu Vaccine QUAD 36+ mos IM    Dorris Singh, MD  Family Medicine Teaching Service

## 2017-12-29 LAB — BASIC METABOLIC PANEL
BUN/Creatinine Ratio: 12 (ref 12–28)
BUN: 13 mg/dL (ref 8–27)
CO2: 24 mmol/L (ref 20–29)
CREATININE: 1.07 mg/dL — AB (ref 0.57–1.00)
Calcium: 9.9 mg/dL (ref 8.7–10.3)
Chloride: 99 mmol/L (ref 96–106)
GFR calc Af Amer: 63 mL/min/{1.73_m2} (ref >59)
GFR, EST NON AFRICAN AMERICAN: 54 mL/min/{1.73_m2} — AB (ref >59)
Glucose: 112 mg/dL — ABNORMAL HIGH (ref 65–99)
Potassium: 3.7 mmol/L (ref 3.5–5.2)
SODIUM: 140 mmol/L (ref 134–144)

## 2017-12-29 LAB — HEPATITIS C ANTIBODY (REFLEX)

## 2017-12-29 LAB — HCV COMMENT:

## 2017-12-29 LAB — LIPID PANEL
CHOL/HDL RATIO: 5.3 ratio — AB (ref 0.0–4.4)
CHOLESTEROL TOTAL: 238 mg/dL — AB (ref 100–199)
HDL: 45 mg/dL (ref >39)
LDL CALC: 154 mg/dL — AB (ref 0–99)
Triglycerides: 194 mg/dL — ABNORMAL HIGH (ref 0–149)
VLDL Cholesterol Cal: 39 mg/dL (ref 5–40)

## 2017-12-30 ENCOUNTER — Other Ambulatory Visit: Payer: Self-pay | Admitting: Internal Medicine

## 2017-12-31 ENCOUNTER — Telehealth: Payer: Self-pay | Admitting: Family Medicine

## 2017-12-31 DIAGNOSIS — E785 Hyperlipidemia, unspecified: Secondary | ICD-10-CM

## 2017-12-31 MED ORDER — ATORVASTATIN CALCIUM 10 MG PO TABS: 10.0000 mg | ORAL_TABLET | Freq: Every day | ORAL | 3 refills | Status: DC

## 2017-12-31 NOTE — Telephone Encounter (Signed)
Called patient regarding results and elevated cholesterol.  Discussed at length discussed options for therapy including statin nightly, statin every other day, ezetimibe or Vascepa therapy given triglyceride level.  Best data supports statin.  She is amenable to trying atorvastatin nightly or every other night.  She will call with myalgias arise.

## 2018-02-18 ENCOUNTER — Ambulatory Visit
Admission: RE | Admit: 2018-02-18 | Discharge: 2018-02-18 | Disposition: A | Discharge status: Home / Self Care | Payer: Medicare Other | Source: Ambulatory Visit | Attending: Family Medicine | Admitting: Family Medicine

## 2018-02-18 DIAGNOSIS — Z13820 Encounter for screening for osteoporosis: Secondary | ICD-10-CM | POA: Diagnosis not present

## 2018-02-18 DIAGNOSIS — Z780 Asymptomatic menopausal state: Secondary | ICD-10-CM | POA: Diagnosis not present

## 2018-02-23 ENCOUNTER — Encounter: Payer: Self-pay | Admitting: Family Medicine

## 2018-02-23 NOTE — Progress Notes (Signed)
Sent letter--- DEXA normal, repeat 15 years based upon [place] data below. Diet rich in calcium + D (no supplements), weight bearing activity.   Osteoporosis interval  . T-score>-1.5 repeat 69yr,  . T-score -1.99 to -1.49 repeat 5yr,  . T-score -2.49 to -2.00, repeat 70yr    ([place] MLO, Fine JP, Preisser JS, et al for the Study of Osteoporotic Fractures Research Group. Bone-density testing interval and transition to osteoporosis in older women. N Engl J Med 2012;366(3):225-233)

## 2018-04-26 ENCOUNTER — Ambulatory Visit: Payer: Medicare Other | Admitting: Family Medicine

## 2018-04-27 ENCOUNTER — Other Ambulatory Visit: Payer: Self-pay | Admitting: Internal Medicine

## 2018-04-29 ENCOUNTER — Other Ambulatory Visit: Payer: Self-pay

## 2018-04-29 NOTE — Patient Outreach (Signed)
  [facility]) Care Management Chronic Special Needs Program   04/29/2018  Name: Sandra [name], DOB: September 20, 1950  MRN: [id number]  The client was discussed in today's interdisciplinary care team meeting.  The following issues were discussed:  Client's needs, Changes in health status, Key risk triggers/risk stratification, Care Plan, Coordination of care, Care transitions and Issues/barriers to care  Participants present:  Mahlon Gammon, RNCM; Peter Garter, RNCM; Thea Silversmith, RNCM  Recommendations:  Diabetes program, send educational information for self reported disease, send advanced directive information.  Plan: Send care plan to client; send care plan to primary care provider.  Follow-up:  Outreach within 2-4 months.  Thea Silversmith, RN, MSN, [place] [phone]

## 2018-04-30 ENCOUNTER — Ambulatory Visit (INDEPENDENT_AMBULATORY_CARE_PROVIDER_SITE_OTHER): Payer: HMO | Admitting: Family Medicine

## 2018-04-30 ENCOUNTER — Other Ambulatory Visit: Payer: Self-pay

## 2018-04-30 ENCOUNTER — Encounter: Payer: Self-pay | Admitting: Family Medicine

## 2018-04-30 VITALS — BP 135/70 | HR 62 | Temp 97.6°F | Wt 170.0 lb

## 2018-04-30 DIAGNOSIS — R252 Cramp and spasm: Secondary | ICD-10-CM

## 2018-04-30 DIAGNOSIS — I10 Essential (primary) hypertension: Secondary | ICD-10-CM | POA: Diagnosis not present

## 2018-04-30 DIAGNOSIS — E11311 Type 2 diabetes mellitus with unspecified diabetic retinopathy with macular edema: Secondary | ICD-10-CM

## 2018-04-30 LAB — POCT GLYCOSYLATED HEMOGLOBIN (HGB A1C): HbA1c, POC (controlled diabetic range): 6.5 % (ref 0.0–7.0)

## 2018-04-30 MED ORDER — GABAPENTIN 100 MG PO CAPS: 100.0000 mg | ORAL_CAPSULE | Freq: Three times a day (TID) | ORAL | 0 refills | Status: DC

## 2018-04-30 NOTE — Patient Instructions (Addendum)
It was great seeing you today!  Your diabetes looks great, your A1c is essentially stable at 6.4.  No changes necessary, continue the good work with your diet and exercise.  Regards your cramping, this could be from a variety of reasons.  Continue to hydrate well, and stretch routinely.  We will get a calcium and a potassium to make sure that an electrolyte is not causing her cramping.  In regards to your darkening of skin, this appears to be unlikely to be due to your Pepcid use.  Given that this rash is not itchy and is purely cosmetic, I would continue to apply lotions to see if this helps.  If it becomes itchy or changes come back and see me.

## 2018-05-01 LAB — BASIC METABOLIC PANEL
BUN / CREAT RATIO: 18 (ref 12–28)
BUN: 16 mg/dL (ref 8–27)
CHLORIDE: 99 mmol/L (ref 96–106)
CO2: 22 mmol/L (ref 20–29)
Calcium: 9.5 mg/dL (ref 8.7–10.3)
Creatinine, Ser: 0.91 mg/dL (ref 0.57–1.00)
GFR calc non Af Amer: 66 mL/min/{1.73_m2} (ref >59)
GFR, EST AFRICAN AMERICAN: 76 mL/min/{1.73_m2} (ref >59)
Glucose: 164 mg/dL — ABNORMAL HIGH (ref 65–99)
Potassium: 4.7 mmol/L (ref 3.5–5.2)
Sodium: 139 mmol/L (ref 134–144)

## 2018-05-03 ENCOUNTER — Other Ambulatory Visit: Payer: Self-pay | Admitting: Internal Medicine

## 2018-05-03 ENCOUNTER — Encounter: Payer: Self-pay | Admitting: Family Medicine

## 2018-05-03 DIAGNOSIS — R252 Cramp and spasm: Secondary | ICD-10-CM | POA: Insufficient documentation

## 2018-05-03 NOTE — Addendum Note (Signed)
Addended by: Owens Shark, CARINA on: 05/03/2018 09:18 AM   Modules accepted: Level of Service

## 2018-05-03 NOTE — Assessment & Plan Note (Signed)
Well-controlled.  A1c stable at 6.5.  Continue metformin 500 mg twice daily

## 2018-05-03 NOTE — Assessment & Plan Note (Signed)
Unclear etiology of bilateral extremity cramping.  Takes triamterene/hydrochlorothiazide for blood pressure.  Will check BMP to check calcium and potassium level.  These are low will supplement accordingly.  Advised patient to stretch her legs 3 times per day.  Advised once waking up, once for bed, once during the daytime.

## 2018-05-03 NOTE — Progress Notes (Signed)
   HPI 68 year old female who presents for diabetes follow-up.  A1c stable at 6.5 from 6.4.  States that she has been eating well, has been exercising a little bit less than usual.  Chalked this up to the weather as she likes to walk outside for exercise.  She is been taking her metformin 500 mg twice daily as prescribed.  She states that she is started having some muscle cramps in bilateral extremity.  She states that these usually happen at night and seem to be linked exercise some weight.  She says it will take little bit time for the cramps to go away, and they are quite sore afterwards.   CC: Diabetes check, muscle cramps   ROS:   Review of Systems See HPI for ROS.   CC, SH/smoking status, and VS noted  Objective: BP 135/70   Pulse 62   Temp 97.6 F (36.4 C) (Oral)   Wt 170 lb (77.1 kg)   SpO2 99%   BMI 30.11 kg/m  Gen: 68 year old African-American female, no acute distress, resting comfortably CV: RRR, no murmur Resp: CTAB, no wheezes, non-labored Neuro: Alert and oriented, Speech clear, No gross deficits Bilateral lower extremity: Sensation intact bilateral lower extremity.  No gross deformity bilateral extremity.  Palpable PT/DP bilaterally.   Assessment and plan:  Cramping of feet Unclear etiology of bilateral extremity cramping.  Takes triamterene/hydrochlorothiazide for blood pressure.  Will check BMP to check calcium and potassium level.  These are low will supplement accordingly.  Advised patient to stretch her legs 3 times per day.  Advised once waking up, once for bed, once during the daytime.  Essential hypertension, benign BP 135/70.  Reviewed chart, patient does get nervous in doctors offices and has whitecoat hypertension.  Continue triamterene/hydrochlorothiazide 37.5/25 1 tablet daily.  Continue Toprol 100 mg every 24 hours.  Type 2 diabetes, controlled, with retinopathy Well-controlled.  A1c stable at 6.5.  Continue metformin 500 mg twice  daily   Orders Placed This Encounter  Procedures  . Basic Metabolic Panel  . HgB A1c    Meds ordered this encounter  Medications  . gabapentin (NEURONTIN) 100 MG capsule    Sig: Take 1 capsule (100 mg total) by mouth 3 (three) times daily.    Dispense:  90 capsule    Refill:  0     Guadalupe Dawn MD PGY-2 Family Medicine Resident  05/03/2018 8:57 AM

## 2018-05-03 NOTE — Assessment & Plan Note (Addendum)
BP 135/70.  Reviewed chart, patient does get nervous in doctors offices and has whitecoat hypertension.  Continue triamterene/hydrochlorothiazide 37.5/25 1 tablet daily.  Continue Toprol 100 mg every 24 hours.

## 2018-05-06 ENCOUNTER — Other Ambulatory Visit: Payer: Self-pay

## 2018-05-06 MED ORDER — METFORMIN HCL 500 MG PO TABS: ORAL_TABLET | ORAL | 11 refills | Status: DC

## 2018-05-28 ENCOUNTER — Other Ambulatory Visit: Payer: Self-pay | Admitting: *Deleted

## 2018-05-28 MED ORDER — METOPROLOL SUCCINATE ER 100 MG PO TB24: ORAL_TABLET | ORAL | 5 refills | Status: DC

## 2018-05-28 MED ORDER — TRIAMTERENE-HCTZ 37.5-25 MG PO TABS: 1.0000 | ORAL_TABLET | Freq: Every day | ORAL | 3 refills | Status: DC

## 2018-06-17 ENCOUNTER — Other Ambulatory Visit: Payer: Self-pay

## 2018-06-17 NOTE — Patient Outreach (Signed)
  [facility]) Care Management Chronic Special Needs Program    06/17/2018  Name: Sandra [name], DOB: 08-Dec-1950  MRN: [id number]   RNCM received notification that client has dis-enrolled from the C-SNP plan.   Plan: Case closed. Letter sent to primary care provider.   Thea Silversmith, RN, MSN, [facility] [employer] [phone]

## 2018-07-01 ENCOUNTER — Other Ambulatory Visit: Payer: Self-pay | Admitting: *Deleted

## 2018-07-01 DIAGNOSIS — E785 Hyperlipidemia, unspecified: Secondary | ICD-10-CM

## 2018-07-01 MED ORDER — ATORVASTATIN CALCIUM 10 MG PO TABS: 10.0000 mg | ORAL_TABLET | Freq: Every day | ORAL | 2 refills | Status: DC

## 2018-07-08 ENCOUNTER — Telehealth (INDEPENDENT_AMBULATORY_CARE_PROVIDER_SITE_OTHER): Payer: HMO | Admitting: Family Medicine

## 2018-07-08 ENCOUNTER — Other Ambulatory Visit: Payer: Self-pay

## 2018-07-08 DIAGNOSIS — I10 Essential (primary) hypertension: Secondary | ICD-10-CM

## 2018-07-08 DIAGNOSIS — H93A9 Pulsatile tinnitus, unspecified ear: Secondary | ICD-10-CM

## 2018-07-08 NOTE — Assessment & Plan Note (Addendum)
BP will need to be checked in person.  Could be the possible cause of her pulsatile tinnitus.  BP check tomorrow during appointment and adjust meds as needed.

## 2018-07-08 NOTE — Progress Notes (Signed)
[facility] Visit  Patient consented to have virtual visit. Method of visit: Telephone "don't have that kind of phone"  Encounter participants: Patient: Sandra [name] - located at home  Provider: Benay Pike - located at [facility] Others (if applicable): none  Chief Complaint: right side of head throbbing  HPI: Patient has been complaining of head "throbbing" on the right side in the temporal region for about 1 month.  She states it feels like a heartbeat, has been constant.  It gets better when she is sitting down, and louder at night when she is trying to sleep and if she gets up and exerts herself.  She states it is not a "pain", just an annoying throbbing.  She denies tenderness to palpation, vision changes, jaw claudication, focal neural deficits, dizziness, nausea vomiting.  Patient states she has been taking her blood pressure medication diligently, but has not been able to check her blood pressure in any while due to not having a meter at home and not being able to do it at the pharmacy due to construction.      ROS: per HPI  Pertinent PMHx: Hypertension, mitral valve prolapse, diabetes  Exam:  Respiratory: Patient denies cough, dyspnea, fever.  Patient able to speak in full sentences without difficulty.  No coughs heard during conversation.  Assessment/Plan:  Essential hypertension, benign This throbbing sensation the patient is feeling is most likely her sensing her pulse.  Not a true headache, due to patient not having any pain.  Unlikely to be temporal arteritis based on lack of pain, jaw claudication, tenderness, vision changes.  Question is why she is hearing it over the past month and not before.  I have scheduled her for a well visit tomorrow so that we can take her blood pressure to make sure it has been elevated since her last visit and to get a better physical exam related to this throbbing.  Possible cause is pulsatile  tinnitus.  Pulsatile tinnitus The symptoms patient describes: throbbing sensation near the right temple that feels like a 'heartbeat', and gets louder during quiet periods such as before sleep, while not having other concerning symptoms such as headache, jaw claudication, vision changes, Tenderness to palpation, most likely describes pulsatile tinnitus.  There are multiple possible causes of pulsatile tinnitus including blood vessel malformations, conductive hearing loss, PAD of the carotid arteries, and high blood pressure, among others.  Given that she has not been able to check her BP in a while and has a hx of HTN, I made her an appointment to come in tomorrow to check her BP and so we can auscultate for carotid bruits and adjust her medications if necessary.  If her BP is well controlled we would need to consider further workup such as carotid u/s and a hearing test.      Time spent during visit with patient: 20 minutes

## 2018-07-09 ENCOUNTER — Other Ambulatory Visit: Payer: Self-pay

## 2018-07-09 ENCOUNTER — Ambulatory Visit (INDEPENDENT_AMBULATORY_CARE_PROVIDER_SITE_OTHER): Payer: Medicare HMO | Admitting: Family Medicine

## 2018-07-09 VITALS — BP 176/88 | HR 61 | Temp 98.3°F | Ht 62.0 in | Wt 175.4 lb

## 2018-07-09 DIAGNOSIS — R51 Headache: Secondary | ICD-10-CM | POA: Diagnosis not present

## 2018-07-09 DIAGNOSIS — I10 Essential (primary) hypertension: Secondary | ICD-10-CM | POA: Diagnosis not present

## 2018-07-09 DIAGNOSIS — H93A9 Pulsatile tinnitus, unspecified ear: Secondary | ICD-10-CM | POA: Insufficient documentation

## 2018-07-09 DIAGNOSIS — R519 Headache, unspecified: Secondary | ICD-10-CM

## 2018-07-09 MED ORDER — TRIAMTERENE-HCTZ 75-50 MG PO TABS: 1.0000 | ORAL_TABLET | Freq: Every day | ORAL | 0 refills | Status: DC

## 2018-07-09 NOTE — Patient Instructions (Signed)
Thank you for coming in to see [place] today. Please see below to review our plan for today's visit.  1. Please discontinue the old triamterene-HCTZ pill (37.5-25 mg tab). Start the new increased dose (75-50 mg). 2. Take you blood pressure medications in the evening before bedtime. This includes the triamterene-HCTZ and the Toprol-XL. 3. Check your blood pressure cuff's accuracy at the pharmacy. Record you blood pressure 3 times per week over the next 2 weeks. I will call and reach out to you to make sure you are controlled.  Please call the clinic at [phone] if your symptoms worsen or you have any concerns. It was our pleasure to serve you.  Harriet Butte, [place], PGY-3

## 2018-07-09 NOTE — Assessment & Plan Note (Signed)
The symptoms patient describes: throbbing sensation near the right temple that feels like a 'heartbeat', and gets louder during quiet periods such as before sleep, while not having other concerning symptoms such as headache, jaw claudication, vision changes, Tenderness to palpation, most likely describes pulsatile tinnitus.  There are multiple possible causes of pulsatile tinnitus including blood vessel malformations, conductive hearing loss, PAD of the carotid arteries, and high blood pressure, among others.  Given that she has not been able to check her BP in a while and has a hx of HTN, I made her an appointment to come in tomorrow to check her BP and so we can auscultate for carotid bruits and adjust her medications if necessary.  If her BP is well controlled we would need to consider further workup such as carotid u/s and a hearing test.

## 2018-07-09 NOTE — Assessment & Plan Note (Signed)
Elevated 180s/80s with beta-blocker, thiazide, and K sparing diuretic.  Anaphylaxis to ACE-inhibitor.  Patient reports good adherence making secondary causes more concerning.  She is obese with history of diabetes which appears to be well controlled based on her A1c in February.  There do not seem to be any other medications contributing to hypertension. - We will increase triamterene-HCTZ to 75-50 mg daily continue Toprol XL at 100 mg daily (HR 61 bpm today) - May need to consider adding calcium channel blocker if persistently elevated and investigate secondary causes of hypertension including sleep study, repeat kidney function (normal as of February 2020), and thyroid study - Reviewed return precautions, RTC 1 month, will reach out by phone in approximately 1-2 weeks to reassess

## 2018-07-09 NOTE — Telephone Encounter (Signed)
This encounter was created in error - please disregard.

## 2018-07-09 NOTE — Progress Notes (Signed)
   Subjective   Patient ID: Sandra [name]    DOB: 07-02-1950, 68 y.o. female   MRN: [id number]  CC: "my blood pressure is high"  HPI: Sandra [name] is a 68 y.o. female who presents to clinic today for the following:  High blood pressure: Patient has a longstanding hypertensive and diabetic with good control until recently.  Patient reports elevated blood pressure over the last few weeks in the 180s over 90s.  She reports good adherence to her triamterene-HCTZ and metoprolol.  She usually takes these in the morning.  She has a history of angioedema with lisinopril.  She is a non-smoker.  Is alcohol or illicit drug use.  She does have history of chronic pain but uses Tylenol and denies NSAID use.  Right scalp pain: Patient reports tenderness to right scalp over the last few days since her blood pressure became more elevated.  She denies any change in vision, trismus, proximal muscle weakness, dizziness, hearing changes, shortness of breath, chest pain, decreased urination, lower extremity swelling.  She reports having a similar sensation approximately 2 months ago which resolved.  ROS: see HPI for pertinent.  [place]: Reviewed. Smoking status reviewed. Medications reviewed.  Objective   BP (!) 176/88   Pulse 61   Temp 98.3 F (36.8 C) (Oral)   Ht _0  (1.575 m)   Wt 175 lb 6 oz (79.5 kg)   SpO2 98%   BMI 32.08 kg/m  Vitals and nursing note reviewed.  General: well nourished, well developed, NAD with non-toxic appearance HEENT: normocephalic, atraumatic, moist mucous membranes, PERRLA, EOMI Neck: supple, non-tender without lymphadenopathy Cardiovascular: regular rate and rhythm without murmurs, rubs, or gallops Lungs: clear to auscultation bilaterally with normal work of breathing Abdomen: obese, soft, non-tender, non-distended, normoactive bowel sounds Skin: warm, dry, no rashes or lesions, cap refill < 2 seconds Extremities: warm and well perfused, normal tone, no edema  Neuro: CN II-XII intact  Assessment & Plan   Resistant hypertension Elevated 180s/80s with beta-blocker, thiazide, and K sparing diuretic.  Anaphylaxis to ACE-inhibitor.  Patient reports good adherence making secondary causes more concerning.  She is obese with history of diabetes which appears to be well controlled based on her A1c in February.  There do not seem to be any other medications contributing to hypertension. - We will increase triamterene-HCTZ to 75-50 mg daily continue Toprol XL at 100 mg daily (HR 61 bpm today) - May need to consider adding calcium channel blocker if persistently elevated and investigate secondary causes of hypertension including sleep study, repeat kidney function (normal as of February 2020), and thyroid study - Reviewed return precautions, RTC 1 month, will reach out by phone in approximately 1-2 weeks to reassess  Scalp tenderness Subacute.  Likely secondary to uncontrolled hypertension.  Low concern for giant cell arteritis.  Less consistent with migraine. - See plan for resistant hypertension - Reviewed return precautions, would consider ESR and prednisone taper if she becomes symptomatic despite blood pressure control  No orders of the defined types were placed in this encounter.  Meds ordered this encounter  Medications  . triamterene-hydrochlorothiazide (MAXZIDE) 75-50 MG tablet    Sig: Take 1 tablet by mouth daily.    Dispense:  90 tablet    Refill:  0    Harriet Butte, DO [place], PGY-3 07/09/2018, 2:27 PM

## 2018-07-09 NOTE — Assessment & Plan Note (Signed)
Subacute.  Likely secondary to uncontrolled hypertension.  Low concern for giant cell arteritis.  Less consistent with migraine. - See plan for resistant hypertension - Reviewed return precautions, would consider ESR and prednisone taper if she becomes symptomatic despite blood pressure control

## 2018-07-23 ENCOUNTER — Telehealth: Payer: Self-pay | Admitting: Family Medicine

## 2018-07-23 NOTE — Telephone Encounter (Signed)
Called to follow-up blood pressure since appointment on 07/09/2018.  Patient tolerating increased dose of triamterene-HCTZ.  BP have been better controlled with systolics averaging 112T and diastolic 62O.  Patient would like to follow-up with PCP at next available appointment.  Scheduled follow-up on 08/07/2018 at 4:20 PM.  Harriet Butte, [place], PGY-3

## 2018-08-07 ENCOUNTER — Ambulatory Visit (INDEPENDENT_AMBULATORY_CARE_PROVIDER_SITE_OTHER): Payer: Medicare HMO | Admitting: Family Medicine

## 2018-08-07 ENCOUNTER — Other Ambulatory Visit: Payer: Self-pay

## 2018-08-07 ENCOUNTER — Encounter: Payer: Self-pay | Admitting: Family Medicine

## 2018-08-07 VITALS — BP 154/82 | HR 63

## 2018-08-07 DIAGNOSIS — I10 Essential (primary) hypertension: Secondary | ICD-10-CM | POA: Diagnosis not present

## 2018-08-07 DIAGNOSIS — E785 Hyperlipidemia, unspecified: Secondary | ICD-10-CM

## 2018-08-07 DIAGNOSIS — E1169 Type 2 diabetes mellitus with other specified complication: Secondary | ICD-10-CM

## 2018-08-07 LAB — POCT GLYCOSYLATED HEMOGLOBIN (HGB A1C): HbA1c, POC (controlled diabetic range): 7.4 % — AB (ref 0.0–7.0)

## 2018-08-07 MED ORDER — AMLODIPINE BESYLATE 5 MG PO TABS: 5.0000 mg | ORAL_TABLET | Freq: Every day | ORAL | 0 refills | Status: DC

## 2018-08-07 NOTE — Patient Instructions (Addendum)
It was great seeing you!  Blood pressure is still elevated at today's visit, initially was 172/82 and then eventually was 154/82.  I think adding on a third blood pressure medication called Norvasc will be helpful.  Take 5 mg nightly.  I would like see back in around a month for blood pressure recheck to see how things are going.  We rechecked your hemoglobin A1c that came back at 7.4.  This is a possible increase from your last 6.5.  This is likely a combination of factors including running out of your metformin, eating a little bit worse, and not being able to exercise much.  We opted to try and do a little better with diet and exercise and I will see back for this in 3 months to better evaluate.

## 2018-08-08 DIAGNOSIS — E785 Hyperlipidemia, unspecified: Secondary | ICD-10-CM | POA: Insufficient documentation

## 2018-08-08 DIAGNOSIS — E1169 Type 2 diabetes mellitus with other specified complication: Secondary | ICD-10-CM | POA: Insufficient documentation

## 2018-08-08 NOTE — Assessment & Plan Note (Signed)
Had a long discussion with patient about her increased A1c.  She is up from 6.5-7.4.  States that she has not been exercising as well and has been eating a little bit worse secondary to the coronavirus epidemic and the weather.  Came to mutual decision to give patient trial of diet and exercise to see if she can lower on her own.  Did discuss that my recommendation would be to increase to 1 g twice daily of metformin if she fails to improve her A1c by next check. -Continue metformin 500 mg twice daily -Discussed lifestyle modifications -Follow-up 3 months for A1c check

## 2018-08-08 NOTE — Assessment & Plan Note (Signed)
Reviewed lipid panel from October 2019.  Cholesterol 238, triglyceride 194, LDL 154.  Has been taking atorvastatin 10 mg at night.  Patient says taking it at night at lower dose has been helping her symptoms of myalgias significantly.  Recheck cholesterol panel in October.

## 2018-08-08 NOTE — Assessment & Plan Note (Signed)
Pressure initially 179/100, did decrease to 154/102 after 15 minutes.  Patient states that she has been taking her Toprol-XL 100 mg, and Maxide 75/50 as prescribed.  Part of her increased hypertension could be secondary to increasingly sedentary lifestyle and poor eating secondary to coronavirus pandemic and weather outside.  Will add on Norvasc 5 mg daily.  Patient is about to try make better lifestyle changes to help with her diabetes and hypertension.  If still significantly elevated then will need to work-up other causes of hypertension. -Start Norvasc 5 mg daily -Continue Toprol-XL 100 mg daily -Continue Maxide 5/50 -Follow-up in 1 month

## 2018-08-08 NOTE — Progress Notes (Signed)
HPI 68 year old female who presents for diabetes and hypertension management.  From chart review patient seen in office on 07/09/2018 for resistant hypertension.  Her dose of Maxide was increased to 75/50.  On presentation clinic she was 179/100.  After waiting a period of 15 minutes recheck patient was 154/105.  Patient states that she is not sure why her blood pressure is continually increased.  She states she has not been eating any differently, but does state that she has not been exercising as much due to the weather and the coronavirus.  Of note patient hemoglobin A1c came back at 7.4 which is increased from 6.5.  At that point patient did relate that she has not been eating quite as well recently.  She states that her blood pressures have been in the 140s consistently when she checks them at blood pressure stations at supermarkets/pharmacies.  She has been taking her medications.  At that appointment she was switched to taking her statin at night.  She states that this has been very good for her and has been a huge improvement in terms of limiting her myalgias.  CC: Blood pressure and diabetes management   ROS:   Review of Systems See HPI for ROS.   CC, SH/smoking status, and VS noted  Objective: BP (!) 154/82    Pulse 63    SpO2 99%  Gen: She is a 68 year old female, no acute distress, African-American, resting comfortably CV: Regular rate rhythm, no M/R/G.  Skin warm and dry Resp: CTAB, no wheezes, non-labored Abd: SNTND, BS present, no guarding or organomegaly Neuro: Alert and oriented, Speech clear, No gross deficits   Assessment and plan:  Type 2 diabetes mellitus with other specified complication ([place]) Had a long discussion with patient about her increased A1c.  She is up from 6.5-7.4.  States that she has not been exercising as well and has been eating a little bit worse secondary to the coronavirus epidemic and the weather.  Came to mutual decision to give patient trial  of diet and exercise to see if she can lower on her own.  Did discuss that my recommendation would be to increase to 1 g twice daily of metformin if she fails to improve her A1c by next check. -Continue metformin 500 mg twice daily -Discussed lifestyle modifications -Follow-up 3 months for A1c check  Hyperlipidemia associated with type 2 diabetes mellitus ([place]) Reviewed lipid panel from October 2019.  Cholesterol 238, triglyceride 194, LDL 154.  Has been taking atorvastatin 10 mg at night.  Patient says taking it at night at lower dose has been helping her symptoms of myalgias significantly.  Recheck cholesterol panel in October.  Essential hypertension, benign Pressure initially 179/100, did decrease to 154/102 after 15 minutes.  Patient states that she has been taking her Toprol-XL 100 mg, and Maxide 75/50 as prescribed.  Part of her increased hypertension could be secondary to increasingly sedentary lifestyle and poor eating secondary to coronavirus pandemic and weather outside.  Will add on Norvasc 5 mg daily.  Patient is about to try make better lifestyle changes to help with her diabetes and hypertension.  If still significantly elevated then will need to work-up other causes of hypertension. -Start Norvasc 5 mg daily -Continue Toprol-XL 100 mg daily -Continue Maxide 5/50 -Follow-up in 1 month   Orders Placed This Encounter  Procedures   POCT glycosylated hemoglobin (Hb A1C)    Associate with Z13.1    Meds ordered this encounter  Medications  amLODipine (NORVASC) 5 MG tablet    Sig: Take 1 tablet (5 mg total) by mouth at bedtime.    Dispense:  90 tablet    Refill:  0    Guadalupe Dawn MD PGY-2 Family Medicine Resident  08/08/2018 6:46 PM

## 2018-11-05 ENCOUNTER — Other Ambulatory Visit: Payer: Self-pay | Admitting: *Deleted

## 2018-11-05 DIAGNOSIS — I10 Essential (primary) hypertension: Secondary | ICD-10-CM

## 2018-11-05 MED ORDER — TRIAMTERENE-HCTZ 75-50 MG PO TABS: 1.0000 | ORAL_TABLET | Freq: Every day | ORAL | 0 refills | Status: DC

## 2018-11-11 ENCOUNTER — Other Ambulatory Visit: Payer: Self-pay | Admitting: Family Medicine

## 2018-11-21 ENCOUNTER — Other Ambulatory Visit: Payer: Self-pay | Admitting: Family Medicine

## 2018-11-21 DIAGNOSIS — Z1231 Encounter for screening mammogram for malignant neoplasm of breast: Secondary | ICD-10-CM

## 2018-11-28 ENCOUNTER — Encounter: Payer: Self-pay | Admitting: Family Medicine

## 2018-11-28 ENCOUNTER — Other Ambulatory Visit: Payer: Self-pay

## 2018-11-28 ENCOUNTER — Ambulatory Visit (INDEPENDENT_AMBULATORY_CARE_PROVIDER_SITE_OTHER): Payer: Medicare HMO | Admitting: Family Medicine

## 2018-11-28 VITALS — BP 124/80 | HR 74

## 2018-11-28 DIAGNOSIS — E1169 Type 2 diabetes mellitus with other specified complication: Secondary | ICD-10-CM | POA: Diagnosis not present

## 2018-11-28 DIAGNOSIS — Z23 Encounter for immunization: Secondary | ICD-10-CM

## 2018-11-28 DIAGNOSIS — I10 Essential (primary) hypertension: Secondary | ICD-10-CM

## 2018-11-28 DIAGNOSIS — E785 Hyperlipidemia, unspecified: Secondary | ICD-10-CM

## 2018-11-28 DIAGNOSIS — Z0000 Encounter for general adult medical examination without abnormal findings: Secondary | ICD-10-CM

## 2018-11-28 LAB — POCT GLYCOSYLATED HEMOGLOBIN (HGB A1C): HbA1c, POC (controlled diabetic range): 7.3 % — AB (ref 0.0–7.0)

## 2018-11-28 LAB — POCT UA - MICROALBUMIN
Albumin/Creatinine Ratio, Urine, POC: <30
Creatinine, POC: 200 mg/dL
Microalbumin Ur, POC: 30 mg/L

## 2018-11-28 NOTE — Patient Instructions (Addendum)
It was great seeing you again today! Your blood pressure looks great, I think we will keep you in the same regimen.  Your a1c is unchanged. It is in a well controlled range. Congrats on improving your excercise and your good diet.  It feels like your diarrhea and rash this morning after you eat is not bothering too much.  For that rash you can apply some topical hydrocortisone if you want to.  If you like your diarrhea be worked up a little bit thoroughly please let me know and come back for visit.  We performed a diabetic foot exam today and it was good.  We will get a urine micro albumin to check to see her kidneys are filtering your protein like it supposed to.  We also got your flu shot today.  When you see your optometrist please have them send [place] those records.

## 2018-12-02 NOTE — Assessment & Plan Note (Signed)
A1c stable at 7.3 from 7.4.  Can continue taking the metformin 500 mg twice daily.  Could consider possibly increasing at 1 g twice daily but will continue for now.  Has anaphylactic allergy to lisinopril.  Blood pressure well controlled.  On atorvastatin 10 mg daily.  Urine microalbumin obtained, foot exam done.  Patient to get ophthalmology exam soon and will send [place] those records.

## 2018-12-02 NOTE — Assessment & Plan Note (Signed)
Taking atorvastatin 10 mg.  Due for lipid panel in 1 month.  We will draw at next visit with A1c.

## 2018-12-02 NOTE — Progress Notes (Signed)
   HPI 68 year old female presents for diabetes and blood pressure management.  Patient states that she is been trying to be more active and has been eating a little better.  A1c stable at 7.3 from 7.4.  Patient also presents for hypertension management.  Blood pressure at today's visit is 124/80.  She has been taking her metoprolol, amlodipine, and Maxzide as prescribed.  Patient is interested in a flu shot today.  She is also overdue for urine microalbumin and foot exam.  CC: Diabetes and hypertension management   ROS:   Review of Systems See HPI for ROS.   CC, SH/smoking status, and VS noted  Objective: BP 124/80   Pulse 74   SpO2 97%  Gen: No acute distress, resting comfortably, very pleasant 68 year old African-American female HEENT: Mucous membranes, EOMI, PERRLA CV: Regular rate and rhythm, no M/R/G Resp: Lungs clear to auscultation bilaterally, no accessory muscle use Abd: SNTND, BS present, no guarding or organomegaly Neuro: Alert and oriented, Speech clear, No gross deficits Foot exam: Dorsalis pedis, posterior tibialis intact bilateral lower extremity.  Skin warm and dry.  Sensation intact all nerve distributions.   Assessment and plan:  Type 2 diabetes mellitus with other specified complication (HCC) 123456 stable at 7.3 from 7.4.  Can continue taking the metformin 500 mg twice daily.  Could consider possibly increasing at 1 g twice daily but will continue for now.  Has anaphylactic allergy to lisinopril.  Blood pressure well controlled.  On atorvastatin 10 mg daily.  Urine microalbumin obtained, foot exam done.  Patient to get ophthalmology exam soon and will send [place] those records.  Hyperlipidemia associated with type 2 diabetes mellitus (HCC) Taking atorvastatin 10 mg.  Due for lipid panel in 1 month.  We will draw at next visit with A1c.  Essential hypertension, benign Doing well on current regimen.  BP 124/80.  Will continue Maxide 75/50, amlodipine 5 mg,  metoprolol XL 100 mg.  Preventative health care Flu vaccine given today.  Drew urine microalbumin, performed foot exam.   Orders Placed This Encounter  Procedures  . Flu Vaccine QUAD 36+ mos IM  . HgB A1c  . POCT UA - Microalbumin    No orders of the defined types were placed in this encounter.    Guadalupe Dawn MD PGY-3 Family Medicine Resident  12/02/2018 11:31 AM

## 2018-12-02 NOTE — Assessment & Plan Note (Signed)
Doing well on current regimen.  BP 124/80.  Will continue Maxide 75/50, amlodipine 5 mg, metoprolol XL 100 mg.

## 2018-12-02 NOTE — Assessment & Plan Note (Signed)
Flu vaccine given today.  Drew urine microalbumin, performed foot exam.

## 2018-12-06 ENCOUNTER — Other Ambulatory Visit: Payer: Self-pay | Admitting: Family Medicine

## 2019-01-03 ENCOUNTER — Ambulatory Visit
Admission: RE | Admit: 2019-01-03 | Discharge: 2019-01-03 | Disposition: A | Discharge status: Home / Self Care | Payer: Medicare HMO | Source: Ambulatory Visit | Attending: Family Medicine | Admitting: Family Medicine

## 2019-01-03 ENCOUNTER — Other Ambulatory Visit: Payer: Self-pay

## 2019-01-03 DIAGNOSIS — Z1231 Encounter for screening mammogram for malignant neoplasm of breast: Secondary | ICD-10-CM

## 2019-01-15 ENCOUNTER — Telehealth: Payer: Self-pay

## 2019-01-16 ENCOUNTER — Telehealth: Payer: Self-pay

## 2019-01-20 MED ORDER — OMEPRAZOLE 20 MG PO CPDR: 20.0000 mg | DELAYED_RELEASE_CAPSULE | Freq: Every day | ORAL | 0 refills | Status: DC

## 2019-01-20 NOTE — Telephone Encounter (Signed)
Entered in error  Guadalupe Dawn MD PGY-3 Family Medicine Resident

## 2019-01-20 NOTE — Telephone Encounter (Signed)
Sent in rx  Guadalupe Dawn MD PGY-3 Family Medicine Resident

## 2019-02-04 ENCOUNTER — Other Ambulatory Visit: Payer: Self-pay | Admitting: Family Medicine

## 2019-02-04 DIAGNOSIS — I10 Essential (primary) hypertension: Secondary | ICD-10-CM

## 2019-02-07 ENCOUNTER — Other Ambulatory Visit: Payer: Self-pay | Admitting: Family Medicine

## 2019-02-07 DIAGNOSIS — E785 Hyperlipidemia, unspecified: Secondary | ICD-10-CM

## 2019-03-27 ENCOUNTER — Other Ambulatory Visit: Payer: Self-pay | Admitting: Family Medicine

## 2019-04-16 ENCOUNTER — Encounter: Payer: Self-pay | Admitting: Family Medicine

## 2019-04-16 ENCOUNTER — Other Ambulatory Visit: Payer: Self-pay

## 2019-04-16 ENCOUNTER — Ambulatory Visit (INDEPENDENT_AMBULATORY_CARE_PROVIDER_SITE_OTHER): Payer: Medicare Other | Admitting: Family Medicine

## 2019-04-16 VITALS — BP 126/80 | HR 63 | Wt 173.2 lb

## 2019-04-16 DIAGNOSIS — E785 Hyperlipidemia, unspecified: Secondary | ICD-10-CM | POA: Diagnosis not present

## 2019-04-16 DIAGNOSIS — E1169 Type 2 diabetes mellitus with other specified complication: Secondary | ICD-10-CM

## 2019-04-16 DIAGNOSIS — R143 Flatulence: Secondary | ICD-10-CM | POA: Diagnosis not present

## 2019-04-16 DIAGNOSIS — I10 Essential (primary) hypertension: Secondary | ICD-10-CM

## 2019-04-16 LAB — POCT GLYCOSYLATED HEMOGLOBIN (HGB A1C): HbA1c, POC (controlled diabetic range): 7.2 % — AB (ref 0.0–7.0)

## 2019-04-16 NOTE — Patient Instructions (Addendum)
It was great seeing you again today!  Your diabetes is stable from the last time.  I think is a good idea to go ahead and check a cholesterol panel and a kidney function panel today.  I will give you a call with these results.  Try to schedule an eye exam as soon as you can.  Diabetes can cause a lot of damage to the eyes and it is good to try and catch this early.  Lastly your bowel gas is not really a medical problem, but it is a social problem.  Is not can to be harmful to have a lot of gas.  If it is causing significant social problems you can take Gas-X, which is an over-the-counter medication.  I gave you a handout for this.  You can also try ginger for a more natural alternative.  If these steps do not really work please let me know and we can dive a little bit more into your diet.  Oftentimes diet high in protein can cause this problem.  The other possibility is that as people age they will oftentimes lose lactase which is an enzyme that breaks down lactose.  Is fairly common to have lactose intolerance later in life.

## 2019-04-16 NOTE — Progress Notes (Signed)
   CHIEF COMPLAINT / HPI: 69 year old female who presents for A1c check.  Takes Metformin 500 mg twice daily, has been trying to eat well and stay active.  A1c stable at 7.2 from 7.3.  I discussed the importance of getting ophthalmology exam.  Patient has complaints of frequent gaseous bloating recently.  She states that it has been a problem for socially recently.  She remarks some days having increased flatulence, and stomach rumbling.  No abdominal pain.  No appetite difficulty.  No constipation or diarrhea.  Upon review patient had elevated lipid panel back in 2019 which has not been rechecked recently.   OBJECTIVE: BP 126/80   Pulse 63   Wt 173 lb 3.2 oz (78.6 kg)   SpO2 99%   BMI 31.68 kg/m   Gen: 69 year old African-American female, no acute distress, very pleasant CV: Regular rate and rhythm, no M/R/G Resp: Lungs clear to auscultation bilaterally, no accessory muscle use Abd: Soft, nontender, nondistended Neuro: Alert and oriented, Speech clear, No gross deficits   ASSESSMENT / PLAN:  Flatulence Patient is increased flatulence likely related to her diet.  Explained that as individuals grow older, especially in certain ethnic groups, they can lose the lactase enzyme.  May have very mild symptoms of lactose intolerance.  Can try over-the-counter Gas-X see if this works for her.  Can follow-up if symptoms do not improve.  Can dive further into dietary history at next appointment if needed.  Essential hypertension, benign Well-controlled at 126/80 today.  Continue amlodipine 5 mg, metoprolol succinate 100 mg daily, Maxide 75/50.  We will draw BMP to assess electrolyte panel.  Hyperlipidemia associated with type 2 diabetes mellitus (HCC) Currently tolerating atorvastatin 10 mg.  Has a history of myalgias on other statins.  We will draw a lipid panel today.  If still elevated can consider adding on Zetia.  Type 2 diabetes mellitus with other specified complication (HCC) Stable at  7.2.  Up-to-date on all screening aside from ophthalmology exam.  Continue Metformin 500 mg twice daily   Guadalupe Dawn MD PGY-3 Family Medicine Resident [place]

## 2019-04-16 NOTE — Assessment & Plan Note (Signed)
Stable at 7.2.  Up-to-date on all screening aside from ophthalmology exam.  Continue Metformin 500 mg twice daily

## 2019-04-16 NOTE — Assessment & Plan Note (Signed)
Patient is increased flatulence likely related to her diet.  Explained that as individuals grow older, especially in certain ethnic groups, they can lose the lactase enzyme.  May have very mild symptoms of lactose intolerance.  Can try over-the-counter Gas-X see if this works for her.  Can follow-up if symptoms do not improve.  Can dive further into dietary history at next appointment if needed.

## 2019-04-16 NOTE — Assessment & Plan Note (Signed)
Currently tolerating atorvastatin 10 mg.  Has a history of myalgias on other statins.  We will draw a lipid panel today.  If still elevated can consider adding on Zetia.

## 2019-04-16 NOTE — Assessment & Plan Note (Signed)
Well-controlled at 126/80 today.  Continue amlodipine 5 mg, metoprolol succinate 100 mg daily, Maxide 75/50.  We will draw BMP to assess electrolyte panel.

## 2019-04-17 LAB — BASIC METABOLIC PANEL
BUN/Creatinine Ratio: 19 (ref 12–28)
BUN: 20 mg/dL (ref 8–27)
CO2: 22 mmol/L (ref 20–29)
Calcium: 9.6 mg/dL (ref 8.7–10.3)
Chloride: 102 mmol/L (ref 96–106)
Creatinine, Ser: 1.08 mg/dL — ABNORMAL HIGH (ref 0.57–1.00)
GFR calc Af Amer: 61 mL/min/{1.73_m2} (ref >59)
GFR calc non Af Amer: 53 mL/min/{1.73_m2} — ABNORMAL LOW (ref >59)
Glucose: 185 mg/dL — ABNORMAL HIGH (ref 65–99)
Potassium: 3.8 mmol/L (ref 3.5–5.2)
Sodium: 142 mmol/L (ref 134–144)

## 2019-04-17 LAB — LIPID PANEL
Chol/HDL Ratio: 3.8 ratio (ref 0.0–4.4)
Cholesterol, Total: 173 mg/dL (ref 100–199)
HDL: 45 mg/dL (ref >39)
LDL Chol Calc (NIH): 95 mg/dL (ref 0–99)
Triglycerides: 192 mg/dL — ABNORMAL HIGH (ref 0–149)
VLDL Cholesterol Cal: 33 mg/dL (ref 5–40)

## 2019-04-23 ENCOUNTER — Other Ambulatory Visit: Payer: Self-pay | Admitting: Family Medicine

## 2019-05-26 ENCOUNTER — Other Ambulatory Visit: Payer: Self-pay

## 2019-05-26 DIAGNOSIS — I10 Essential (primary) hypertension: Secondary | ICD-10-CM

## 2019-05-26 MED ORDER — TRIAMTERENE-HCTZ 75-50 MG PO TABS: 1.0000 | ORAL_TABLET | Freq: Every day | ORAL | 3 refills | Status: DC

## 2019-05-26 MED ORDER — METFORMIN HCL 500 MG PO TABS: ORAL_TABLET | ORAL | 10 refills | Status: DC

## 2019-05-29 ENCOUNTER — Other Ambulatory Visit: Payer: Self-pay | Admitting: *Deleted

## 2019-05-29 DIAGNOSIS — I10 Essential (primary) hypertension: Secondary | ICD-10-CM

## 2019-05-29 MED ORDER — METFORMIN HCL 500 MG PO TABS: ORAL_TABLET | ORAL | 10 refills | Status: DC

## 2019-05-29 MED ORDER — TRIAMTERENE-HCTZ 75-50 MG PO TABS: 1.0000 | ORAL_TABLET | Freq: Every day | ORAL | 3 refills | Status: DC

## 2019-06-16 IMAGING — NM NM PARATHYROID W/ SPECT
5 series · 20 of 20 positions shown · non-contrast
Comparison: None.

CLINICAL DATA: Evaluate for parathyroid adenoma.

EXAM:
NM PARATHYROID SCINTIGRAPHY AND SPECT IMAGING
TECHNIQUE: Following intravenous administration of radiopharmaceutical, early
and 2-hour delayed planar images were obtained in the anterior
projection. Delayed triplanar SPECT images were also obtained at 2
hours.
RADIOPHARMACEUTICALS:  25.4 mCi Ic-VVm Sestamibi IV

[Series 1: spect - (id)_(id)_cor · 8.3mm · 8.28mm/px · 6 of 64 frames shown]
[frame 6/64]
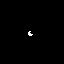
[frame 16/64]
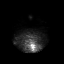
[frame 27/64]
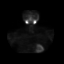
[frame 38/64]
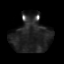
[frame 48/64]
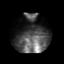
[frame 59/64]
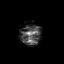

[Series 1: spect - (id)_(id)_tra · 8.3mm · 8.28mm/px · 6 of 64 frames shown]
[frame 6/64]
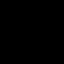
[frame 16/64]
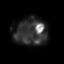
[frame 27/64]
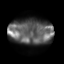
[frame 38/64]
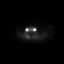
[frame 48/64]
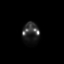
[frame 59/64]
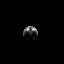

[Series 1: 15 min ant · 4.14mm/px · 1 of 1 slices shown]
[im 1/1]
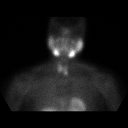

[Series 3: 2 hr ant · 4.14mm/px · 1 of 1 slices shown]
[im 1/1]
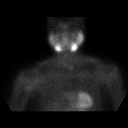

[Series 4: spect parathyroid · 8.28mm/px · 6 of 64 frames shown]
[frame 6/64]
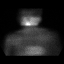
[frame 16/64]
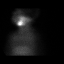
[frame 27/64]
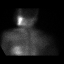
[frame 38/64]
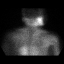
[frame 48/64]
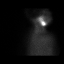
[frame 59/64]
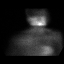

[20 of 20 positions shown; findings below may reference images not displayed]

FINDINGS: On the early images there is radiotracer activity noted within both
lobes of the thyroid gland. On the 2 hour delayed anterior
projection images there is washout of the radiopharmaceutical from
the thyroid gland. There is a focus of mildly persistent increased
uptake localizing to the area around the inferior pole of the left
lobe of thyroid gland which may reflect parathyroid adenoma. This is
less apparent on the SPECT images.
IMPRESSION: 1. There is a persistent focus of mildly increased uptake localizing
to the area around the inferior pole of the left lobe of thyroid
gland. Findings may represent location of suspected parathyroid
adenoma.

## 2019-07-24 ENCOUNTER — Other Ambulatory Visit: Payer: Self-pay | Admitting: Family Medicine

## 2019-08-04 ENCOUNTER — Encounter: Payer: Self-pay | Admitting: Family Medicine

## 2019-08-04 ENCOUNTER — Ambulatory Visit (INDEPENDENT_AMBULATORY_CARE_PROVIDER_SITE_OTHER): Payer: Medicare Other | Admitting: Family Medicine

## 2019-08-04 ENCOUNTER — Other Ambulatory Visit: Payer: Self-pay

## 2019-08-04 VITALS — BP 127/84 | HR 64 | Ht 62.0 in | Wt 170.6 lb

## 2019-08-04 DIAGNOSIS — Z0000 Encounter for general adult medical examination without abnormal findings: Secondary | ICD-10-CM

## 2019-08-04 DIAGNOSIS — E1169 Type 2 diabetes mellitus with other specified complication: Secondary | ICD-10-CM

## 2019-08-04 LAB — POCT GLYCOSYLATED HEMOGLOBIN (HGB A1C): HbA1c, POC (controlled diabetic range): 7.2 % — AB (ref 0.0–7.0)

## 2019-08-04 NOTE — Patient Instructions (Signed)
It was great seeing you today!  We performed an initial Medicare wellness visit.  Overall really looks great the only thing that you are overdue for is an eye exam.  Since you were already here we went ahead and did a hemoglobin a1c. I will call you with the results.

## 2019-08-04 NOTE — Progress Notes (Addendum)
Subjective:   Sandra [name] is a 69 y.o. female who presents for Medicare Annual (Subsequent) preventive examination.  Review of Systems:   Cardiac Risk Factors include: advanced age (>64men, >64 women);diabetes mellitus;dyslipidemia     Objective:    Vitals: BP 127/84   Pulse 64   Ht 5\' 2"  (1.575 m)   Wt 170 lb 9.6 oz (77.4 kg)   BMI 31.20 kg/m   Body mass index is 31.2 kg/m.  Advanced Directives 11/28/2018 08/07/2018 07/09/2018 04/30/2018 12/28/2017 10/25/2017 08/21/2017  Does Patient Have a Medical Advance Directive? No No No No No No No  Would patient like information on creating a medical advance directive? No - Patient declined No - Patient declined No - Patient declined No - Patient declined No - Patient declined No - Patient declined No - Patient declined    Tobacco Social History   Tobacco Use  Smoking Status Never Smoker  Smokeless Tobacco Never Used     Counseling given: Not Answered   Clinical Intake: Hemoglobin a1c obtained  Past Medical History:  Diagnosis Date  . Anemia   . Arthritis   . CHF (congestive heart failure) ([place])   . Diabetes mellitus without complication ([place])    type 2  . Family history of adverse reaction to anesthesia    daughter has a hard time waking up  . GERD (gastroesophageal reflux disease)   . Headache    when she first started with HTN  . Heart murmur    has not had any issues  . History of kidney stones   . Hyperlipidemia   . Hyperparathyroidism ([place])   . Hyperplastic colon polyp   . Hypertension   . Irregular heart rate   . Restless legs    Past Surgical History:  Procedure Laterality Date  . COLONOSCOPY    . COLONOSCOPY    . dental procedure     cyst removed from gum  . LUMBAR LAMINECTOMY/DECOMPRESSION MICRODISCECTOMY N/A 11/02/2017   Procedure: Laminectomy and Foraminotomy - Lumbar Three-Lumbar Four - Lumbar Four-Lumbar Five;  Surgeon: Kary Kos, MD;  Location: [facility];  Service: Neurosurgery;  Laterality:  N/A;  Laminectomy and Foraminotomy - Lumbar Three-Lumbar Four - Lumbar Four-Lumbar Five  . PARATHYROIDECTOMY Left 12/29/2016   Procedure: LEFT INFERIOR PARATHYROIDECTOMY;  Surgeon: Armandina Gemma, MD;  Location: [facility];  Service: General;  Laterality: Left;  . TUBAL LIGATION     Family History  Problem Relation Age of Onset  . Hypertension Mother   . Alzheimer's disease Mother   . Diabetes Father   . Hypertension Father   . Diabetes Brother   . Heart disease Brother   . Diabetes Brother   . Diabetes Sister   . Heart disease Daughter   . Cancer Neg Hx        no breast or colon cancer  . Colon cancer Neg Hx   . Esophageal cancer Neg Hx   . Pancreatic cancer Neg Hx   . Rectal cancer Neg Hx   . Stomach cancer Neg Hx    Social History   Socioeconomic History  . Marital status: Married    Spouse name: Not on file  . Number of children: 3  . Years of education: Not on file  . Highest education level: Not on file  Occupational History  . Occupation: clean houses  Tobacco Use  . Smoking status: Never Smoker  . Smokeless tobacco: Never Used  Substance and Sexual Activity  . Alcohol use: No  .  Drug use: No  . Sexual activity: Not on file  Other Topics Concern  . Not on file  Social History Narrative  . Not on file   Social Determinants of Health   Financial Resource Strain:   . Difficulty of Paying Living Expenses:   Food Insecurity:   . Worried About Charity fundraiser in the Last Year:   . Arboriculturist in the Last Year:   Transportation Needs:   . Film/video editor (Medical):   Marland Kitchen Lack of Transportation (Non-Medical):   Physical Activity:   . Days of Exercise per Week:   . Minutes of Exercise per Session:   Stress:   . Feeling of Stress :   Social Connections:   . Frequency of Communication with Friends and Family:   . Frequency of Social Gatherings with Friends and Family:   . Attends Religious Services:   . Active Member of Clubs or Organizations:   .  Attends Archivist Meetings:   Marland Kitchen Marital Status:     Outpatient Encounter Medications as of 08/04/2019  Medication Sig  . cholecalciferol (VITAMIN D3) 25 MCG (1000 UNIT) tablet Take 100 Units by mouth daily.  Marland Kitchen amLODipine (NORVASC) 5 MG tablet TAKE ONE TABLET BY MOUTH AT BEDTIME  . atorvastatin (LIPITOR) 10 MG tablet TAKE ONE TABLET BY MOUTH DAILY AT 6PM  . metFORMIN (GLUCOPHAGE) 500 MG tablet TAKE ONE TABLET BY MOUTH TWICE A DAY WITH MEALS  . metoprolol succinate (TOPROL-XL) 100 MG 24 hr tablet TAKE ONE TABLET BY MOUTH DAILY WITH OR FOLLOWING A MEAL  . omeprazole (PRILOSEC) 20 MG capsule TAKE ONE CAPSULE BY MOUTH DAILY  . triamterene-hydrochlorothiazide (MAXZIDE) 75-50 MG tablet Take 1 tablet by mouth daily.  . vitamin B-12 (CYANOCOBALAMIN) 500 MCG tablet Take 500 mcg by mouth daily.  . [DISCONTINUED] cyclobenzaprine (FLEXERIL) 10 MG tablet Take 1 tablet (10 mg total) by mouth 3 (three) times daily as needed for muscle spasms.  . [DISCONTINUED] famotidine (PEPCID) 20 MG tablet Take 1 tablet (20 mg total) by mouth 2 (two) times daily.  . [DISCONTINUED] gabapentin (NEURONTIN) 100 MG capsule Take 1 capsule (100 mg total) by mouth 3 (three) times daily.  . [DISCONTINUED] HYDROcodone-acetaminophen (NORCO/VICODIN) 5-325 MG tablet Take 1 tablet by mouth every 4 (four) hours as needed for moderate pain.   No facility-administered encounter medications on file as of 08/04/2019.    Activities of Daily Living In your present state of health, do you have any difficulty performing the following activities: 08/04/2019  Hearing? N  Vision? N  Difficulty concentrating or making decisions? N  Walking or climbing stairs? N  Dressing or bathing? N  Doing errands, shopping? N  Preparing Food and eating ? N  Using the Toilet? N  In the past six months, have you accidently leaked urine? N  Do you have problems with loss of bowel control? N  Managing your Medications? N  Managing your Finances? N   Housekeeping or managing your Housekeeping? N  Some recent data might be hidden    Patient Care Team: Guadalupe Dawn, MD as PCP - General (Family Medicine)    Assessment:   This is a routine wellness examination for [place].  Exercise Activities and Dietary recommendations Current Exercise Habits: Home exercise routine, Type of exercise: walking, Time (Minutes): 30, Frequency (Times/Week): 3, Weekly Exercise (Minutes/Week): 90, Intensity: Moderate, Exercise limited by: None identified  Goals    .  Acknowledge receipt of Programme researcher, broadcasting/film/video    .  Advanced Care Planning complete within 6-9 months as directed by client.    . Client understands the importance of follow-up with providers by attending scheduled visits    . Client will use Assistive Devices as needed and verbalize understanding of device use    . Client will verbalize knowledge of self management of Hypertension as evidences by BP reading of 140/90 or less; or as defined by provider    . HEMOGLOBIN A1C < 7.0    . Maintain timely refills of diabetic medication as prescribed within the year .    Marland Kitchen Obtain annual  Lipid Profile, LDL-C    . Obtain Annual Eye (retinal)  Exam     . Obtain Annual Foot Exam    . Obtain annual screen for micro albuminuria (urine) , nephropathy (kidney problems)    . Obtain Hemoglobin A1C at least 2 times per year    . Visit Primary Care Provider or Endocrinologist at least 2 times per year     . Weight (lb) < 160 lb (72.6 kg)     Goal is to get down to 160lbs by the time her church opens back up in July.       Fall Risk Fall Risk  08/04/2019 08/04/2019 11/28/2018 08/07/2018 07/09/2018  Falls in the past year? 0 0 0 0 0  Number falls in past yr: 0 0 0 0 -  Injury with Fall? 0 0 - - -  Follow up Falls evaluation completed;Falls prevention discussed Falls evaluation completed;Falls prevention discussed - - -   Is the patient's home free of loose throw rugs in walkways, pet beds, electrical  cords, etc?   yes      Grab bars in the bathroom? no      Handrails on the stairs?   yes      Adequate lighting?   yes  Patient rating of health (0-10) scale: 9  Depression Screen PHQ 2/9 Scores 08/04/2019 11/28/2018 08/07/2018 07/09/2018  PHQ - 2 Score 0 0 0 0     Cognitive Function     6CIT Screen 08/04/2019  What Year? 0 points  What month? 0 points  What time? 0 points  Count back from 20 0 points  Months in reverse 0 points  Repeat phrase 0 points  Total Score 0    Immunization History  Administered Date(s) Administered  . Influenza, Seasonal, Injecte, Preservative Fre 02/08/2016  . Influenza,inj,Quad PF,6+ Mos 03/11/2014, 12/28/2017, 11/28/2018  . Influenza-Unspecified 04/03/2017  . PFIZER SARS-COV-2 Vaccination 06/02/2019, 06/16/2019  . Pneumococcal Conjugate-13 04/25/2016  . Pneumococcal Polysaccharide-23 03/11/2014  . Pneumococcal-Unspecified 04/11/2016  . Td 11/30/2003  . Tdap 03/11/2014     Screening Tests Health Maintenance  Topic Date Due  . OPHTHALMOLOGY EXAM  02/20/2015  . INFLUENZA VACCINE  10/12/2019  . HEMOGLOBIN A1C  10/14/2019  . FOOT EXAM  11/28/2019  . URINE MICROALBUMIN  11/28/2019  . MAMMOGRAM  01/02/2021  . PNA vac Low Risk Adult (2 of 2 - PPSV23) 04/11/2021  . COLONOSCOPY  05/08/2021  . TETANUS/TDAP  03/11/2024  . DEXA SCAN  Completed  . COVID-19 Vaccine  Completed  . Hepatitis C Screening  Completed    Cancer Screenings: Lung: Low Dose CT Chest recommended if Age 77-80 years, 30 pack-year currently smoking OR have quit w/in 15years. Patient does not qualify. Breast:  Up to date on Mammogram? Yes   Up to date of Bone Density/Dexa? Yes Colorectal: up to date, colorectal screening next due on 05/08/2016  Additional  Screenings: Hepatitis C Screening: performed 12/28/2017, was negative PHQ-2: 0     Plan:   patient will go get optho screening done next month some time. Reviewed goals with patient. She will follow up with me for these  goals as well as for continued management of her current medical problems.  I have personally reviewed and noted the following in the patient's chart:   . Medical and social history . Use of alcohol, tobacco or illicit drugs  . Current medications and supplements . Functional ability and status . Nutritional status . Physical activity . Advanced directives . List of other physicians . Hospitalizations, surgeries, and ER visits in previous 12 months . Vitals . Screenings to include cognitive, depression, and falls . Referrals and appointments  In addition, I have reviewed and discussed with patient certain preventive protocols, quality metrics, and best practice recommendations. A written personalized care plan for preventive services as well as general preventive health recommendations were provided to patient.  Guadalupe Dawn, MD  08/04/2019

## 2019-08-19 ENCOUNTER — Other Ambulatory Visit: Payer: Self-pay

## 2019-08-19 DIAGNOSIS — E785 Hyperlipidemia, unspecified: Secondary | ICD-10-CM

## 2019-08-19 MED ORDER — METOPROLOL SUCCINATE ER 100 MG PO TB24: ORAL_TABLET | ORAL | 4 refills | Status: DC

## 2019-08-19 MED ORDER — OMEPRAZOLE 20 MG PO CPDR: 20.0000 mg | DELAYED_RELEASE_CAPSULE | Freq: Every day | ORAL | 0 refills | Status: DC

## 2019-08-19 MED ORDER — ATORVASTATIN CALCIUM 10 MG PO TABS: 10.0000 mg | ORAL_TABLET | Freq: Every day | ORAL | 1 refills | Status: DC

## 2019-08-19 MED ORDER — METFORMIN HCL 500 MG PO TABS: ORAL_TABLET | ORAL | 10 refills | Status: DC

## 2019-08-22 ENCOUNTER — Other Ambulatory Visit: Payer: Self-pay | Admitting: Family Medicine

## 2019-09-11 DIAGNOSIS — H524 Presbyopia: Secondary | ICD-10-CM | POA: Diagnosis not present

## 2019-09-11 DIAGNOSIS — E119 Type 2 diabetes mellitus without complications: Secondary | ICD-10-CM | POA: Diagnosis not present

## 2019-09-11 DIAGNOSIS — H2513 Age-related nuclear cataract, bilateral: Secondary | ICD-10-CM | POA: Diagnosis not present

## 2019-09-11 DIAGNOSIS — H5203 Hypermetropia, bilateral: Secondary | ICD-10-CM | POA: Diagnosis not present

## 2019-09-11 DIAGNOSIS — H52223 Regular astigmatism, bilateral: Secondary | ICD-10-CM | POA: Diagnosis not present

## 2019-09-11 LAB — HM DIABETES EYE EXAM

## 2019-10-22 ENCOUNTER — Ambulatory Visit: Payer: Medicare Other | Admitting: Student in an Organized Health Care Education/Training Program

## 2019-10-29 ENCOUNTER — Ambulatory Visit (HOSPITAL_COMMUNITY)
Admission: EM | Admit: 2019-10-29 | Discharge: 2019-10-29 | Disposition: A | Discharge status: Home / Self Care | Payer: Medicare Other | Attending: Urgent Care | Admitting: Urgent Care

## 2019-10-29 ENCOUNTER — Ambulatory Visit: Payer: Medicare Other | Admitting: Student in an Organized Health Care Education/Training Program

## 2019-10-29 ENCOUNTER — Other Ambulatory Visit: Payer: Self-pay

## 2019-10-29 ENCOUNTER — Encounter (HOSPITAL_COMMUNITY): Payer: Self-pay

## 2019-10-29 DIAGNOSIS — E860 Dehydration: Secondary | ICD-10-CM

## 2019-10-29 DIAGNOSIS — K529 Noninfective gastroenteritis and colitis, unspecified: Secondary | ICD-10-CM | POA: Diagnosis not present

## 2019-10-29 DIAGNOSIS — R197 Diarrhea, unspecified: Secondary | ICD-10-CM | POA: Diagnosis not present

## 2019-10-29 DIAGNOSIS — R110 Nausea: Secondary | ICD-10-CM

## 2019-10-29 LAB — POCT URINALYSIS DIPSTICK, ED / UC
Glucose, UA: NEGATIVE mg/dL
Leukocytes,Ua: NEGATIVE
Nitrite: NEGATIVE
Protein, ur: 30 mg/dL — AB
Specific Gravity, Urine: 1.025 (ref 1.005–1.030)
Urobilinogen, UA: 0.2 mg/dL (ref 0.0–1.0)
pH: 5.5 (ref 5.0–8.0)

## 2019-10-29 MED ORDER — LOPERAMIDE HCL 2 MG PO CAPS
4.0000 mg | ORAL_CAPSULE | Freq: Two times a day (BID) | ORAL | 0 refills | Status: DC | PRN
Start: 2019-10-29 — End: 2021-08-14

## 2019-10-29 MED ORDER — CIPROFLOXACIN HCL 500 MG PO TABS: 500.0000 mg | ORAL_TABLET | Freq: Two times a day (BID) | ORAL | 0 refills | Status: DC

## 2019-10-29 MED ORDER — ONDANSETRON 8 MG PO TBDP
8.0000 mg | ORAL_TABLET | Freq: Three times a day (TID) | ORAL | 0 refills | Status: DC | PRN
Start: 2019-10-29 — End: 2021-08-14

## 2019-10-29 NOTE — ED Triage Notes (Signed)
Pt c/o diarrheax5 days. Pt denies N/V. Pt states the diarrhea started after she ate a bowl of cereal 5 days ago. Pt denies recent atx use. Pt states has gone about 50 times total in past 5 days.

## 2019-10-29 NOTE — ED Provider Notes (Signed)
[facility]   MRN: [id number] DOB: 07-08-1950  Subjective:   Sandra [name] is a 69 y.o. female presenting for 5-day history of persistent diarrhea, has had more than 10 bowel movements per day.  States that she is able to drink fluids but elicits diarrhea shortly after.  Has had slight nausea and mild intermittent lower abdominal cramping pains.  Believes that symptoms started from eating cereal using bad milk.  Has otherwise kept her diet routine the same.  Has been using antidiarrheal medicines over-the-counter.  Denies fever, headache, cough, chest pain, shortness of breath, vomiting, bloody stools, recent antibiotic use, recent hospitalizations.  She has had a colonoscopy and states that they have been okay except for polyps.  Denies history of Crohn, ulcerative colitis or chronic GI issue.  No current facility-administered medications for this encounter.  Current Outpatient Medications:  .  amLODipine (NORVASC) 5 MG tablet, TAKE ONE TABLET BY MOUTH AT BEDTIME, Disp: 90 tablet, Rfl: 3 .  atorvastatin (LIPITOR) 10 MG tablet, Take 1 tablet (10 mg total) by mouth daily., Disp: 90 tablet, Rfl: 1 .  cholecalciferol (VITAMIN D3) 25 MCG (1000 UNIT) tablet, Take 100 Units by mouth daily., Disp: , Rfl:  .  metFORMIN (GLUCOPHAGE) 500 MG tablet, TAKE ONE TABLET BY MOUTH TWICE A DAY WITH MEALS, Disp: 180 tablet, Rfl: 10 .  metoprolol succinate (TOPROL-XL) 100 MG 24 hr tablet, Take with or immediately following a meal., Disp: 90 tablet, Rfl: 4 .  omeprazole (PRILOSEC) 20 MG capsule, TAKE ONE CAPSULE BY MOUTH DAILY, Disp: 30 capsule, Rfl: 0 .  triamterene-hydrochlorothiazide (MAXZIDE) 75-50 MG tablet, Take 1 tablet by mouth daily., Disp: 90 tablet, Rfl: 3 .  vitamin B-12 (CYANOCOBALAMIN) 500 MCG tablet, Take 500 mcg by mouth daily., Disp: , Rfl:    Allergies  Allergen Reactions  . Lisinopril Swelling     Angioedema  . Pravastatin Other (See Comments)    Severe myalgias  . Simvastatin  Other (See Comments)    myalgias  . Aspirin Other (See Comments)    Was told to never take,not sure why thought it had something to do with BP    Past Medical History:  Diagnosis Date  . Anemia   . Arthritis   . CHF (congestive heart failure) ([place])   . Diabetes mellitus without complication ([place])    type 2  . Family history of adverse reaction to anesthesia    daughter has a hard time waking up  . GERD (gastroesophageal reflux disease)   . Headache    when she first started with HTN  . Heart murmur    has not had any issues  . History of kidney stones   . Hyperlipidemia   . Hyperparathyroidism ([place])   . Hyperplastic colon polyp   . Hypertension   . Irregular heart rate   . Restless legs      Past Surgical History:  Procedure Laterality Date  . COLONOSCOPY    . COLONOSCOPY    . dental procedure     cyst removed from gum  . LUMBAR LAMINECTOMY/DECOMPRESSION MICRODISCECTOMY N/A 11/02/2017   Procedure: Laminectomy and Foraminotomy - Lumbar Three-Lumbar Four - Lumbar Four-Lumbar Five;  Surgeon: Kary Kos, MD;  Location: [facility];  Service: Neurosurgery;  Laterality: N/A;  Laminectomy and Foraminotomy - Lumbar Three-Lumbar Four - Lumbar Four-Lumbar Five  . PARATHYROIDECTOMY Left 12/29/2016   Procedure: LEFT INFERIOR PARATHYROIDECTOMY;  Surgeon: Armandina Gemma, MD;  Location: [facility];  Service: General;  Laterality: Left;  .  TUBAL LIGATION      Family History  Problem Relation Age of Onset  . Hypertension Mother   . Alzheimer's disease Mother   . Diabetes Father   . Hypertension Father   . Diabetes Brother   . Heart disease Brother   . Diabetes Brother   . Diabetes Sister   . Heart disease Daughter   . Cancer Neg Hx        no breast or colon cancer  . Colon cancer Neg Hx   . Esophageal cancer Neg Hx   . Pancreatic cancer Neg Hx   . Rectal cancer Neg Hx   . Stomach cancer Neg Hx     Social History   Tobacco Use  . Smoking status: Never Smoker  . Smokeless tobacco:  Never Used  Vaping Use  . Vaping Use: Never used  Substance Use Topics  . Alcohol use: No  . Drug use: No    ROS   Objective:   Vitals: BP (!) 147/77   Pulse 80   Temp 99 F (37.2 C) (Oral)   Resp 18   Ht 5\' 2"  (1.575 m)   Wt 160 lb (72.6 kg)   SpO2 96%   BMI 29.26 kg/m   Physical Exam Constitutional:      General: She is not in acute distress.    Appearance: Normal appearance. She is well-developed. She is not ill-appearing, toxic-appearing or diaphoretic.  HENT:     Head: Normocephalic and atraumatic.     Nose: Nose normal.     Mouth/Throat:     Mouth: Mucous membranes are moist.  Eyes:     General: No scleral icterus.       Right eye: No discharge.        Left eye: No discharge.     Extraocular Movements: Extraocular movements intact.     Conjunctiva/sclera: Conjunctivae normal.     Pupils: Pupils are equal, round, and reactive to light.  Cardiovascular:     Rate and Rhythm: Normal rate and regular rhythm.     Pulses: Normal pulses.     Heart sounds: Normal heart sounds. No murmur heard.  No friction rub. No gallop.   Pulmonary:     Effort: Pulmonary effort is normal. No respiratory distress.     Breath sounds: Normal breath sounds. No stridor. No wheezing, rhonchi or rales.  Abdominal:     General: Bowel sounds are normal. There is no distension.     Palpations: Abdomen is soft. There is no mass.     Tenderness: There is abdominal tenderness (mild, generalized). There is no right CVA tenderness, left CVA tenderness, guarding or rebound.  Skin:    General: Skin is warm and dry.     Findings: No rash.  Neurological:     Mental Status: She is alert and oriented to person, place, and time.  Psychiatric:        Mood and Affect: Mood normal.        Behavior: Behavior normal.        Thought Content: Thought content normal.        Judgment: Judgment normal.     Results for orders placed or performed during the hospital encounter of 10/29/19 (from the past  24 hour(s))  POC Urinalysis dipstick     Status: Abnormal   Collection Time: 10/29/19  2:17 PM  Result Value Ref Range   Glucose, UA NEGATIVE NEGATIVE mg/dL   Bilirubin Urine MODERATE (A) NEGATIVE   Ketones,  ur TRACE (A) NEGATIVE mg/dL   Specific Gravity, Urine 1.025 1.005 - 1.030   Hgb urine dipstick TRACE (A) NEGATIVE   pH 5.5 5.0 - 8.0   Protein, ur 30 (A) NEGATIVE mg/dL   Urobilinogen, UA 0.2 0.0 - 1.0 mg/dL   Nitrite NEGATIVE NEGATIVE   Leukocytes,Ua NEGATIVE NEGATIVE    Assessment and Plan :   PDMP not reviewed this encounter.  1. Colitis   2. Diarrhea, unspecified type   3. Nausea   4. Dehydration     Start ciprofloxacin just for 5 days for colitis. Use supportive care otherwise, Zofran, loperamide, push fluids. Counseled patient on potential for adverse effects with medications prescribed/recommended today, ER and return-to-clinic precautions discussed, patient verbalized understanding.    Jaynee Eagles, PA-C 10/29/19 1425

## 2019-10-29 NOTE — Discharge Instructions (Addendum)
Start ciprofloxacin twice daily for 5 days. Make sure you push fluids drinking mostly water but mix it with Gatorade.  Try to eat light meals including soups, broths and soft foods, fruits.  You may use Zofran for your nausea and vomiting once every 8 hours.  Imodium can help with diarrhea but use this carefully limiting it to 1-2 times per day only if you are having a lot of diarrhea.  Please return to the clinic if symptoms worsen or you start having severe abdominal pain not helped by taking Tylenol or start having bloody stools or blood in the vomit.

## 2019-11-12 DIAGNOSIS — Z0000 Encounter for general adult medical examination without abnormal findings: Secondary | ICD-10-CM | POA: Diagnosis not present

## 2019-11-12 DIAGNOSIS — R197 Diarrhea, unspecified: Secondary | ICD-10-CM | POA: Diagnosis not present

## 2019-11-12 DIAGNOSIS — I10 Essential (primary) hypertension: Secondary | ICD-10-CM | POA: Diagnosis not present

## 2019-11-12 DIAGNOSIS — E785 Hyperlipidemia, unspecified: Secondary | ICD-10-CM | POA: Diagnosis not present

## 2019-11-12 DIAGNOSIS — E1169 Type 2 diabetes mellitus with other specified complication: Secondary | ICD-10-CM | POA: Diagnosis not present

## 2019-11-13 DIAGNOSIS — R197 Diarrhea, unspecified: Secondary | ICD-10-CM | POA: Diagnosis not present

## 2019-11-19 DIAGNOSIS — E1169 Type 2 diabetes mellitus with other specified complication: Secondary | ICD-10-CM | POA: Diagnosis not present

## 2019-11-19 DIAGNOSIS — R197 Diarrhea, unspecified: Secondary | ICD-10-CM | POA: Diagnosis not present

## 2019-11-19 DIAGNOSIS — N289 Disorder of kidney and ureter, unspecified: Secondary | ICD-10-CM | POA: Diagnosis not present

## 2019-12-02 ENCOUNTER — Other Ambulatory Visit: Payer: Self-pay | Admitting: Internal Medicine

## 2019-12-02 DIAGNOSIS — Z1231 Encounter for screening mammogram for malignant neoplasm of breast: Secondary | ICD-10-CM

## 2019-12-04 DIAGNOSIS — N179 Acute kidney failure, unspecified: Secondary | ICD-10-CM | POA: Diagnosis not present

## 2019-12-24 DIAGNOSIS — N289 Disorder of kidney and ureter, unspecified: Secondary | ICD-10-CM | POA: Diagnosis not present

## 2019-12-24 DIAGNOSIS — N179 Acute kidney failure, unspecified: Secondary | ICD-10-CM | POA: Diagnosis not present

## 2019-12-24 DIAGNOSIS — E1169 Type 2 diabetes mellitus with other specified complication: Secondary | ICD-10-CM | POA: Diagnosis not present

## 2019-12-24 DIAGNOSIS — I10 Essential (primary) hypertension: Secondary | ICD-10-CM | POA: Diagnosis not present

## 2019-12-24 DIAGNOSIS — E785 Hyperlipidemia, unspecified: Secondary | ICD-10-CM | POA: Diagnosis not present

## 2020-01-01 DIAGNOSIS — R634 Abnormal weight loss: Secondary | ICD-10-CM | POA: Diagnosis not present

## 2020-01-01 DIAGNOSIS — K529 Noninfective gastroenteritis and colitis, unspecified: Secondary | ICD-10-CM | POA: Diagnosis not present

## 2020-01-01 DIAGNOSIS — R197 Diarrhea, unspecified: Secondary | ICD-10-CM | POA: Diagnosis not present

## 2020-01-06 ENCOUNTER — Ambulatory Visit
Admission: RE | Admit: 2020-01-06 | Discharge: 2020-01-06 | Disposition: A | Discharge status: Home / Self Care | Payer: Medicare Other | Source: Ambulatory Visit | Attending: Internal Medicine | Admitting: Internal Medicine

## 2020-01-06 ENCOUNTER — Other Ambulatory Visit: Payer: Self-pay

## 2020-01-06 DIAGNOSIS — K529 Noninfective gastroenteritis and colitis, unspecified: Secondary | ICD-10-CM | POA: Diagnosis not present

## 2020-01-06 DIAGNOSIS — Z1231 Encounter for screening mammogram for malignant neoplasm of breast: Secondary | ICD-10-CM

## 2020-01-07 DIAGNOSIS — K529 Noninfective gastroenteritis and colitis, unspecified: Secondary | ICD-10-CM | POA: Diagnosis not present

## 2020-01-08 DIAGNOSIS — K3189 Other diseases of stomach and duodenum: Secondary | ICD-10-CM | POA: Diagnosis not present

## 2020-01-08 DIAGNOSIS — K640 First degree hemorrhoids: Secondary | ICD-10-CM | POA: Diagnosis not present

## 2020-01-08 DIAGNOSIS — R194 Change in bowel habit: Secondary | ICD-10-CM | POA: Diagnosis not present

## 2020-01-08 DIAGNOSIS — R197 Diarrhea, unspecified: Secondary | ICD-10-CM | POA: Diagnosis not present

## 2020-02-09 DIAGNOSIS — Z23 Encounter for immunization: Secondary | ICD-10-CM | POA: Diagnosis not present

## 2020-02-16 ENCOUNTER — Other Ambulatory Visit: Payer: Self-pay | Admitting: Family Medicine

## 2020-03-05 ENCOUNTER — Other Ambulatory Visit: Payer: Self-pay | Admitting: Family Medicine

## 2020-05-05 ENCOUNTER — Other Ambulatory Visit: Payer: Self-pay | Admitting: Family Medicine

## 2020-05-05 DIAGNOSIS — E785 Hyperlipidemia, unspecified: Secondary | ICD-10-CM

## 2020-07-01 ENCOUNTER — Other Ambulatory Visit: Payer: Self-pay | Admitting: Family Medicine

## 2020-07-01 DIAGNOSIS — I10 Essential (primary) hypertension: Secondary | ICD-10-CM

## 2020-10-15 ENCOUNTER — Other Ambulatory Visit: Payer: Self-pay | Admitting: Family Medicine

## 2020-11-03 ENCOUNTER — Other Ambulatory Visit: Payer: Self-pay | Admitting: *Deleted

## 2020-11-03 DIAGNOSIS — E785 Hyperlipidemia, unspecified: Secondary | ICD-10-CM

## 2020-11-04 MED ORDER — ATORVASTATIN CALCIUM 10 MG PO TABS: 10.0000 mg | ORAL_TABLET | Freq: Every day | ORAL | 1 refills | Status: DC

## 2020-11-17 ENCOUNTER — Other Ambulatory Visit: Payer: Self-pay | Admitting: Family Medicine

## 2020-12-07 ENCOUNTER — Other Ambulatory Visit: Payer: Self-pay | Admitting: Family Medicine

## 2020-12-07 DIAGNOSIS — Z1231 Encounter for screening mammogram for malignant neoplasm of breast: Secondary | ICD-10-CM

## 2020-12-15 ENCOUNTER — Other Ambulatory Visit: Payer: Self-pay | Admitting: Family Medicine

## 2020-12-15 DIAGNOSIS — E2839 Other primary ovarian failure: Secondary | ICD-10-CM

## 2021-01-10 ENCOUNTER — Ambulatory Visit: Payer: Medicare Other

## 2021-01-11 ENCOUNTER — Other Ambulatory Visit: Payer: Self-pay | Admitting: Family Medicine

## 2021-01-11 ENCOUNTER — Other Ambulatory Visit: Payer: Self-pay

## 2021-01-11 ENCOUNTER — Ambulatory Visit
Admission: RE | Admit: 2021-01-11 | Discharge: 2021-01-11 | Disposition: A | Discharge status: Home / Self Care | Payer: Medicare Other | Source: Ambulatory Visit | Attending: Family Medicine | Admitting: Family Medicine

## 2021-01-11 DIAGNOSIS — M25552 Pain in left hip: Secondary | ICD-10-CM

## 2021-02-07 ENCOUNTER — Other Ambulatory Visit: Payer: Self-pay

## 2021-02-07 ENCOUNTER — Ambulatory Visit
Admission: RE | Admit: 2021-02-07 | Discharge: 2021-02-07 | Disposition: A | Discharge status: Home / Self Care | Payer: Medicare Other | Source: Ambulatory Visit | Attending: Family Medicine | Admitting: Family Medicine

## 2021-02-07 DIAGNOSIS — Z1231 Encounter for screening mammogram for malignant neoplasm of breast: Secondary | ICD-10-CM

## 2021-03-17 ENCOUNTER — Other Ambulatory Visit: Payer: Self-pay

## 2021-03-17 MED ORDER — AMLODIPINE BESYLATE 5 MG PO TABS: 5.0000 mg | ORAL_TABLET | Freq: Every day | ORAL | 3 refills | Status: DC

## 2021-04-29 ENCOUNTER — Other Ambulatory Visit: Payer: Self-pay | Admitting: Family Medicine

## 2021-04-29 DIAGNOSIS — E785 Hyperlipidemia, unspecified: Secondary | ICD-10-CM

## 2021-05-26 ENCOUNTER — Ambulatory Visit
Admission: RE | Admit: 2021-05-26 | Discharge: 2021-05-26 | Disposition: A | Discharge status: Home / Self Care | Payer: Medicare Other | Source: Ambulatory Visit | Attending: Family Medicine | Admitting: Family Medicine

## 2021-05-26 ENCOUNTER — Other Ambulatory Visit: Payer: Medicare Other

## 2021-08-14 ENCOUNTER — Ambulatory Visit (HOSPITAL_COMMUNITY)
Admission: EM | Admit: 2021-08-14 | Discharge: 2021-08-14 | Disposition: A | Discharge status: Home / Self Care | Payer: Medicare Other | Attending: Family Medicine | Admitting: Family Medicine

## 2021-08-14 DIAGNOSIS — M79605 Pain in left leg: Secondary | ICD-10-CM | POA: Insufficient documentation

## 2021-08-14 DIAGNOSIS — M79604 Pain in right leg: Secondary | ICD-10-CM | POA: Insufficient documentation

## 2021-08-14 LAB — CBC
HCT: 32.6 % — ABNORMAL LOW (ref 36.0–46.0)
Hemoglobin: 10.5 g/dL — ABNORMAL LOW (ref 12.0–15.0)
MCH: 29.9 pg (ref 26.0–34.0)
MCHC: 32.2 g/dL (ref 30.0–36.0)
MCV: 92.9 fL (ref 80.0–100.0)
Platelets: 242 10*3/uL (ref 150–400)
RBC: 3.51 MIL/uL — ABNORMAL LOW (ref 3.87–5.11)
RDW: 14.3 % (ref 11.5–15.5)
WBC: 4.1 10*3/uL (ref 4.0–10.5)
nRBC: 0 % (ref 0.0–0.2)

## 2021-08-14 LAB — BASIC METABOLIC PANEL
Anion gap: 9 (ref 5–15)
BUN: 23 mg/dL (ref 8–23)
CO2: 25 mmol/L (ref 22–32)
Calcium: 9.7 mg/dL (ref 8.9–10.3)
Chloride: 103 mmol/L (ref 98–111)
Creatinine, Ser: 1.02 mg/dL — ABNORMAL HIGH (ref 0.44–1.00)
GFR, Estimated: 59 mL/min — ABNORMAL LOW (ref >60)
Glucose, Bld: 86 mg/dL (ref 70–99)
Potassium: 4.4 mmol/L (ref 3.5–5.1)
Sodium: 137 mmol/L (ref 135–145)

## 2021-08-14 LAB — VITAMIN B12: Vitamin B-12: 502 pg/mL (ref 180–914)

## 2021-08-14 MED ORDER — TRAMADOL HCL 50 MG PO TABS: 50.0000 mg | ORAL_TABLET | Freq: Four times a day (QID) | ORAL | 0 refills | Status: DC | PRN

## 2021-08-14 NOTE — ED Provider Notes (Signed)
[facility]    CSN: [id number] Arrival date & time: 08/14/21  1433      History   Chief Complaint Chief Complaint  Patient presents with   Leg Pain    HPI Sandra [name] is a 71 y.o. female.    Leg Pain Here for bilateral leg pain that has been going on for about a week.  No new trauma or injury.  It is tingling some.  Is actually worse during the day than it is at night.  It extends from her upper lateral thighs to her feet.  She does have diabetes that is well controlled.  Past Medical History:  Diagnosis Date   Anemia    Arthritis    CHF (congestive heart failure) ([place])    Diabetes mellitus without complication ([place])    type 2   Family history of adverse reaction to anesthesia    daughter has a hard time waking up   GERD (gastroesophageal reflux disease)    Headache    when she first started with HTN   Heart murmur    has not had any issues   History of kidney stones    Hyperlipidemia    Hyperparathyroidism ([place])    Hyperplastic colon polyp    Hypertension    Irregular heart rate    Restless legs     Patient Active Problem List   Diagnosis Date Noted   Encounter for Medicare annual wellness exam 08/04/2019   Flatulence 04/16/2019   Hyperlipidemia associated with type 2 diabetes mellitus ([place]) 08/08/2018   Pulsatile tinnitus 07/09/2018   Resistant hypertension 07/09/2018   Cramping of feet 05/03/2018   Spinal stenosis at L4-L5 level 11/02/2017   Hypotension due to hypovolemia 08/21/2017   Muscle spasms of both lower extremities 05/18/2017   GERD (gastroesophageal reflux disease)    Preventative health care 03/11/2014   Type 2 diabetes mellitus with other specified complication ([place])    Chest pain 05/10/2010   Anemia 11/27/2007   Hyperlipidemia 10/01/2006   Essential hypertension, benign 10/01/2006   MITRAL VALVE PROLAPSE 10/01/2006   OSTEOARTHRITIS 10/01/2006    Past Surgical History:  Procedure Laterality Date   COLONOSCOPY      COLONOSCOPY     dental procedure     cyst removed from gum   LUMBAR LAMINECTOMY/DECOMPRESSION MICRODISCECTOMY N/A 11/02/2017   Procedure: Laminectomy and Foraminotomy - Lumbar Three-Lumbar Four - Lumbar Four-Lumbar Five;  Surgeon: Kary Kos, MD;  Location: [facility];  Service: Neurosurgery;  Laterality: N/A;  Laminectomy and Foraminotomy - Lumbar Three-Lumbar Four - Lumbar Four-Lumbar Five   PARATHYROIDECTOMY Left 12/29/2016   Procedure: LEFT INFERIOR PARATHYROIDECTOMY;  Surgeon: Armandina Gemma, MD;  Location: [facility];  Service: General;  Laterality: Left;   TUBAL LIGATION      OB History   No obstetric history on file.      Home Medications    Prior to Admission medications   Medication Sig Start Date End Date Taking? Authorizing Provider  traMADol (ULTRAM) 50 MG tablet Take 1 tablet (50 mg total) by mouth every 6 (six) hours as needed. 08/14/21  Yes Barrett Henle, MD  amLODipine (NORVASC) 5 MG tablet Take 1 tablet (5 mg total) by mouth at bedtime. 03/17/21   Autry-Lott, Naaman Plummer, DO  atorvastatin (LIPITOR) 10 MG tablet TAKE ONE TABLET BY MOUTH DAILY 04/29/21   Autry-Lott, Naaman Plummer, DO  cholecalciferol (VITAMIN D3) 25 MCG (1000 UNIT) tablet Take 100 Units by mouth daily.    [provider]  metFORMIN (GLUCOPHAGE) 500 MG tablet TAKE ONE TABLET BY MOUTH TWICE A DAY WITH MEALS 10/16/20   Autry-Lott, Naaman Plummer, DO  metoprolol succinate (TOPROL-XL) 100 MG 24 hr tablet TAKE ONE TABLET BY MOUTH DAILY WITH OR IMMEDIATELY FOLLOWING A MEAL 11/17/20   Autry-Lott, Naaman Plummer, DO  omeprazole (PRILOSEC) 20 MG capsule TAKE ONE CAPSULE BY MOUTH DAILY 03/09/20   Meccariello, Bernita Raisin, DO  triamterene-hydrochlorothiazide (MAXZIDE) 75-50 MG tablet TAKE ONE TABLET BY MOUTH DAILY 07/02/20   Richarda Osmond, MD  vitamin B-12 (CYANOCOBALAMIN) 500 MCG tablet Take 500 mcg by mouth daily.    [provider]    Family History Family History  Problem Relation Age of Onset   Hypertension Mother    Alzheimer's  disease Mother    Diabetes Father    Hypertension Father    Diabetes Sister    Heart disease Daughter    Diabetes Brother    Heart disease Brother    Diabetes Brother    Cancer Neg Hx        no breast or colon cancer   Colon cancer Neg Hx    Esophageal cancer Neg Hx    Pancreatic cancer Neg Hx    Rectal cancer Neg Hx    Stomach cancer Neg Hx    Breast cancer Neg Hx     Social History Social History   Tobacco Use   Smoking status: Never   Smokeless tobacco: Never  Vaping Use   Vaping Use: Never used  Substance Use Topics   Alcohol use: No   Drug use: No     Allergies   Lisinopril, Pravastatin, Simvastatin, and Aspirin   Review of Systems Review of Systems   Physical Exam Triage Vital Signs ED Triage Vitals [08/14/21 1505]  Enc Vitals Group     BP (!) 161/77     Pulse Rate 60     Resp 18     Temp 98 F (36.7 C)     Temp Source Oral     SpO2 100 %     Weight      Height      Head Circumference      Peak Flow      Pain Score      Pain Loc      Pain Edu?      Excl. in [place]?    No data found.  Updated Vital Signs BP (!) 161/77 (BP Location: Left Arm)   Pulse 60   Temp 98 F (36.7 C) (Oral)   Resp 18   SpO2 100%   Visual Acuity Right Eye Distance:   Left Eye Distance:   Bilateral Distance:    Right Eye Near:   Left Eye Near:    Bilateral Near:     Physical Exam Vitals reviewed.  Constitutional:      General: She is not in acute distress.    Appearance: She is not toxic-appearing.  Cardiovascular:     Rate and Rhythm: Normal rate and regular rhythm.     Heart sounds: No murmur heard. Pulmonary:     Effort: Pulmonary effort is normal.     Breath sounds: Normal breath sounds.  Neurological:     Mental Status: She is alert and oriented to person, place, and time.  Psychiatric:        Behavior: Behavior normal.     UC Treatments / Results  Labs (all labs ordered are listed, but only abnormal results are displayed) Labs Reviewed   CBC  BASIC METABOLIC PANEL  VITAMIN I50    EKG   Radiology No results found.  Procedures Procedures (including critical care time)  Medications Ordered in UC Medications - No data to display  Initial Impression / Assessment and Plan / UC Course  I have reviewed the triage vital signs and the nursing notes.  Pertinent labs & imaging results that were available during my care of the patient were reviewed by me and considered in my medical decision making (see chart for details).     We will do some basic lab work.  I want to do a few tramadol for her in the short-term.  OK on pmp. I have asked her to talk to her PCP office tomorrow when they are open Final Clinical Impressions(s) / UC Diagnoses   Final diagnoses:  Pain in both lower extremities     Discharge Instructions      Tramadol 50 mg--take 1 tablet every 6 hours as needed for pain.  This medication can make you sleepy or dizzy   Please call your primary care office tomorrow when they are open and let them know you are having this trouble  We have drawn blood for a blood count and for a basic chemistry        ED Prescriptions     Medication Sig Dispense Auth. Provider   traMADol (ULTRAM) 50 MG tablet Take 1 tablet (50 mg total) by mouth every 6 (six) hours as needed. 10 tablet Windy Carina Gwenlyn Perking, MD      I have reviewed the PDMP during this encounter.   Barrett Henle, MD 08/14/21 [phone]

## 2021-08-14 NOTE — Discharge Instructions (Addendum)
Tramadol 50 mg--take 1 tablet every 6 hours as needed for pain.  This medication can make you sleepy or dizzy   Please call your primary care office tomorrow when they are open and let them know you are having this trouble  We have drawn blood for a blood count and for a basic chemistry

## 2021-08-14 NOTE — ED Triage Notes (Signed)
C/o bilateral leg pain x 1 week. No injuries or falls to note at this time.

## 2021-08-16 ENCOUNTER — Encounter: Payer: Self-pay | Admitting: *Deleted

## 2021-08-23 ENCOUNTER — Ambulatory Visit: Payer: Medicare Other | Admitting: Physician Assistant

## 2021-08-23 ENCOUNTER — Ambulatory Visit (INDEPENDENT_AMBULATORY_CARE_PROVIDER_SITE_OTHER): Payer: Medicare Other

## 2021-08-23 ENCOUNTER — Ambulatory Visit: Payer: Self-pay

## 2021-08-23 DIAGNOSIS — M5459 Other low back pain: Secondary | ICD-10-CM | POA: Diagnosis not present

## 2021-08-23 DIAGNOSIS — M545 Low back pain, unspecified: Secondary | ICD-10-CM

## 2021-08-23 MED ORDER — METHYLPREDNISOLONE 4 MG PO TBPK: ORAL_TABLET | ORAL | 0 refills | Status: AC

## 2021-08-23 MED ORDER — METHOCARBAMOL 500 MG PO TABS: 500.0000 mg | ORAL_TABLET | Freq: Two times a day (BID) | ORAL | 0 refills | Status: DC | PRN

## 2021-08-23 NOTE — Progress Notes (Signed)
Office Visit Note   Patient: Sandra [name]           Date of Birth: 04-05-1950           MRN: [id number] Visit Date: 08/23/2021              Requested by: Gerlene Fee, [address] [place],  [place] [address] PCP: Gerlene Fee, DO   Assessment & Plan: Visit Diagnoses:  1. Low back pain, unspecified back pain laterality, unspecified chronicity, unspecified whether sciatica present     Plan: Impression is chronic low back pain with bilateral extremity radiculopathy.  At this point, would like to start her on a steroid taper and muscle relaxer.  I will also discussed referral back to Dr. Saintclair Halsted.  She will give them a call to follow-up.  Follow-up with [place] as needed.  Follow-Up Instructions: Return if symptoms worsen or fail to improve.   Orders:  Orders Placed This Encounter  Procedures   XR Lumbar Spine 2-3 Views   Meds ordered this encounter  Medications   methylPREDNISolone (MEDROL DOSEPAK) 4 MG TBPK tablet    Sig: Take as directed    Dispense:  21 tablet    Refill:  0   methocarbamol (ROBAXIN) 500 MG tablet    Sig: Take 1 tablet (500 mg total) by mouth 2 (two) times daily as needed for muscle spasms.    Dispense:  20 tablet    Refill:  0      Procedures: No procedures performed   Clinical Data: No additional findings.   Subjective: Chief Complaint  Patient presents with   Left Leg - Pain   Right Leg - Pain    HPI patient is a pleasant 71 year old female who comes in today with bilateral buttock and leg pain for the past few months.  This is worsened over the past 2 weeks.  No new injury or change in activity.  The pain she has starts in both buttocks and radiates down the lateral aspect of both legs which stops just prior to the feet.  She describes this as a constant pain worse with standing.  She has been using heating pad and taking tramadol, Tylenol and ibuprofen without significant relief.  She notes tingling down both legs.  She denies  bowel or bladder change or saddle paresthesias.  She is status post laminectomy and foraminotomy L3-4 and L4-5 by Dr. Saintclair Halsted in 2019.  Review of Systems as detailed in HPI.  All others reviewed and are negative.   Objective: Vital Signs: There were no vitals taken for this visit.  Physical Exam well-developed well-nourished female no acute distress alert and oriented x3.  Ortho Exam lumbar spine exam reveals moderate spinous and paraspinous tenderness to the lower lumbar levels.  Increased pain with lumbar flexion, extension and rotation.  She has a positive straight leg raise both sides.  No focal weakness.  She is neurovascular intact distally.  Specialty Comments:  No specialty comments available.  Imaging: XR Lumbar Spine 2-3 Views  Result Date: 08/23/2021 X-rays demonstrate marked degenerative changes L3-4 L4-5 and L5-S1 with retrolisthesis L3 and L4    PMFS History: Patient Active Problem List   Diagnosis Date Noted   Encounter for Medicare annual wellness exam 08/04/2019   Flatulence 04/16/2019   Hyperlipidemia associated with type 2 diabetes mellitus ([place]) 08/08/2018   Pulsatile tinnitus 07/09/2018   Resistant hypertension 07/09/2018   Cramping of feet 05/03/2018   Spinal stenosis at L4-L5 level 11/02/2017  Hypotension due to hypovolemia 08/21/2017   Muscle spasms of both lower extremities 05/18/2017   GERD (gastroesophageal reflux disease)    Preventative health care 03/11/2014   Type 2 diabetes mellitus with other specified complication ([place])    Chest pain 05/10/2010   Anemia 11/27/2007   Hyperlipidemia 10/01/2006   Essential hypertension, benign 10/01/2006   MITRAL VALVE PROLAPSE 10/01/2006   OSTEOARTHRITIS 10/01/2006   Past Medical History:  Diagnosis Date   Anemia    Arthritis    CHF (congestive heart failure) ([place])    Diabetes mellitus without complication ([place])    type 2   Family history of adverse reaction to anesthesia    daughter has a hard time  waking up   GERD (gastroesophageal reflux disease)    Headache    when she first started with HTN   Heart murmur    has not had any issues   History of kidney stones    Hyperlipidemia    Hyperparathyroidism ([place])    Hyperplastic colon polyp    Hypertension    Irregular heart rate    Restless legs     Family History  Problem Relation Age of Onset   Hypertension Mother    Alzheimer's disease Mother    Diabetes Father    Hypertension Father    Diabetes Sister    Heart disease Daughter    Diabetes Brother    Heart disease Brother    Diabetes Brother    Cancer Neg Hx        no breast or colon cancer   Colon cancer Neg Hx    Esophageal cancer Neg Hx    Pancreatic cancer Neg Hx    Rectal cancer Neg Hx    Stomach cancer Neg Hx    Breast cancer Neg Hx     Past Surgical History:  Procedure Laterality Date   COLONOSCOPY     COLONOSCOPY     dental procedure     cyst removed from gum   LUMBAR LAMINECTOMY/DECOMPRESSION MICRODISCECTOMY N/A 11/02/2017   Procedure: Laminectomy and Foraminotomy - Lumbar Three-Lumbar Four - Lumbar Four-Lumbar Five;  Surgeon: Kary Kos, MD;  Location: [facility];  Service: Neurosurgery;  Laterality: N/A;  Laminectomy and Foraminotomy - Lumbar Three-Lumbar Four - Lumbar Four-Lumbar Five   PARATHYROIDECTOMY Left 12/29/2016   Procedure: LEFT INFERIOR PARATHYROIDECTOMY;  Surgeon: Armandina Gemma, MD;  Location: [name];  Service: General;  Laterality: Left;   TUBAL LIGATION     Social History   Occupational History   Occupation: clean houses  Tobacco Use   Smoking status: Never   Smokeless tobacco: Never  Vaping Use   Vaping Use: Never used  Substance and Sexual Activity   Alcohol use: No   Drug use: No   Sexual activity: Not on file

## 2021-12-07 ENCOUNTER — Ambulatory Visit: Payer: Medicare Other | Admitting: Orthopaedic Surgery

## 2021-12-07 DIAGNOSIS — M545 Low back pain, unspecified: Secondary | ICD-10-CM | POA: Diagnosis not present

## 2021-12-07 MED ORDER — TRAMADOL HCL 50 MG PO TABS: 50.0000 mg | ORAL_TABLET | Freq: Two times a day (BID) | ORAL | 1 refills | Status: AC | PRN

## 2021-12-07 MED ORDER — METHOCARBAMOL 750 MG PO TABS: 750.0000 mg | ORAL_TABLET | Freq: Two times a day (BID) | ORAL | 0 refills | Status: AC | PRN

## 2021-12-07 MED ORDER — PREDNISONE 10 MG (21) PO TBPK: ORAL_TABLET | ORAL | 0 refills | Status: AC

## 2021-12-07 NOTE — Progress Notes (Signed)
Office Visit Note   Patient: Sandra [name]           Date of Birth: 1950-10-08           MRN: [id number] Visit Date: 12/07/2021              Requested by: Arlyce Dice, MD [address] [place],  [place] [id number] PCP: Arlyce Dice, MD   Assessment & Plan: Visit Diagnoses:  1. Low back pain, unspecified back pain laterality, unspecified chronicity, unspecified whether sciatica present     Plan: Impression is chronic low back pain with bilateral lower extremity radiculopathy.  At this point, I have again recommended referral to Dr. Saintclair Halsted or referral to our new spine surgeon, Dr. Laurance Flatten.  She would like to go back to Dr. Saintclair Halsted.  I have discussed starting her back on a steroid and muscle relaxer as well as sending in some tramadol to take as needed in the interim.  She is agreeable to this plan.  She will follow-up with [place] as needed.  Follow-Up Instructions: Return if symptoms worsen or fail to improve.   Orders:  No orders of the defined types were placed in this encounter.  Meds ordered this encounter  Medications   predniSONE (STERAPRED UNI-PAK 21 TAB) 10 MG (21) TBPK tablet    Sig: Take as directed    Dispense:  21 tablet    Refill:  0   methocarbamol (ROBAXIN-750) 750 MG tablet    Sig: Take 1 tablet (750 mg total) by mouth 2 (two) times daily as needed for muscle spasms.    Dispense:  20 tablet    Refill:  0   traMADol (ULTRAM) 50 MG tablet    Sig: Take 1 tablet (50 mg total) by mouth every 12 (twelve) hours as needed.    Dispense:  30 tablet    Refill:  1      Procedures: No procedures performed   Clinical Data: No additional findings.   Subjective: Chief Complaint  Patient presents with   Lower Back - Pain    HPI patient is a pleasant 71 year old female who comes in today with continued low back pain and bilateral lower extremity radiculopathy.  Her symptoms are constant but worse with lumbar flexion.  She was seen in our office in June with the same  complaints.  She was started on a steroid pack and Robaxin which mildly helped her symptoms.  Her plan at that point was to follow-up with Dr. Saintclair Halsted who was operated on her back before.  Unfortunately, she has not been back to see him.  Review of Systems as detailed in HPI.  All others are negative.   Objective: Vital Signs: There were no vitals taken for this visit.  Physical Exam well-developed well-nourished female no acute distress.  Alert and oriented x3.  Ortho Exam lumbar spine exam is unchanged  Specialty Comments:  No specialty comments available.  Imaging: No new imaging    PMFS History: Patient Active Problem List   Diagnosis Date Noted   Encounter for Medicare annual wellness exam 08/04/2019   Flatulence 04/16/2019   Hyperlipidemia associated with type 2 diabetes mellitus ([place]) 08/08/2018   Pulsatile tinnitus 07/09/2018   Resistant hypertension 07/09/2018   Cramping of feet 05/03/2018   Spinal stenosis at L4-L5 level 11/02/2017   Hypotension due to hypovolemia 08/21/2017   Muscle spasms of both lower extremities 05/18/2017   GERD (gastroesophageal reflux disease)    Preventative health care 03/11/2014  Type 2 diabetes mellitus with other specified complication ([place])    Chest pain 05/10/2010   Anemia 11/27/2007   Hyperlipidemia 10/01/2006   Essential hypertension, benign 10/01/2006   MITRAL VALVE PROLAPSE 10/01/2006   OSTEOARTHRITIS 10/01/2006   Past Medical History:  Diagnosis Date   Anemia    Arthritis    CHF (congestive heart failure) ([facility])    Diabetes mellitus without complication ([place])    type 2   Family history of adverse reaction to anesthesia    daughter has a hard time waking up   GERD (gastroesophageal reflux disease)    Headache    when she first started with HTN   Heart murmur    has not had any issues   History of kidney stones    Hyperlipidemia    Hyperparathyroidism ([place])    Hyperplastic colon polyp    Hypertension    Irregular  heart rate    Restless legs     Family History  Problem Relation Age of Onset   Hypertension Mother    Alzheimer's disease Mother    Diabetes Father    Hypertension Father    Diabetes Sister    Heart disease Daughter    Diabetes Brother    Heart disease Brother    Diabetes Brother    Cancer Neg Hx        no breast or colon cancer   Colon cancer Neg Hx    Esophageal cancer Neg Hx    Pancreatic cancer Neg Hx    Rectal cancer Neg Hx    Stomach cancer Neg Hx    Breast cancer Neg Hx     Past Surgical History:  Procedure Laterality Date   COLONOSCOPY     COLONOSCOPY     dental procedure     cyst removed from gum   LUMBAR LAMINECTOMY/DECOMPRESSION MICRODISCECTOMY N/A 11/02/2017   Procedure: Laminectomy and Foraminotomy - Lumbar Three-Lumbar Four - Lumbar Four-Lumbar Five;  Surgeon: Kary Kos, MD;  Location: [facility];  Service: Neurosurgery;  Laterality: N/A;  Laminectomy and Foraminotomy - Lumbar Three-Lumbar Four - Lumbar Four-Lumbar Five   PARATHYROIDECTOMY Left 12/29/2016   Procedure: LEFT INFERIOR PARATHYROIDECTOMY;  Surgeon: Armandina Gemma, MD;  Location: [facility];  Service: General;  Laterality: Left;   TUBAL LIGATION     Social History   Occupational History   Occupation: clean houses  Tobacco Use   Smoking status: Never   Smokeless tobacco: Never  Vaping Use   Vaping Use: Never used  Substance and Sexual Activity   Alcohol use: No   Drug use: No   Sexual activity: Not on file

## 2022-01-16 ENCOUNTER — Other Ambulatory Visit: Payer: Self-pay | Admitting: Family Medicine

## 2022-01-16 DIAGNOSIS — Z1231 Encounter for screening mammogram for malignant neoplasm of breast: Secondary | ICD-10-CM

## 2022-01-31 ENCOUNTER — Telehealth: Payer: Self-pay | Admitting: Gastroenterology

## 2022-01-31 NOTE — Telephone Encounter (Signed)
Error

## 2022-02-06 ENCOUNTER — Other Ambulatory Visit: Payer: Self-pay | Admitting: *Deleted

## 2022-02-06 MED ORDER — METOPROLOL SUCCINATE ER 100 MG PO TB24: ORAL_TABLET | ORAL | 4 refills | Status: DC

## 2022-03-11 ENCOUNTER — Other Ambulatory Visit: Payer: Self-pay | Admitting: Family Medicine

## 2022-03-20 ENCOUNTER — Ambulatory Visit
Admission: RE | Admit: 2022-03-20 | Discharge: 2022-03-20 | Disposition: A | Discharge status: Home / Self Care | Payer: Medicare Other | Source: Ambulatory Visit | Attending: Family Medicine | Admitting: Family Medicine

## 2022-03-20 DIAGNOSIS — Z1231 Encounter for screening mammogram for malignant neoplasm of breast: Secondary | ICD-10-CM

## 2022-06-02 ENCOUNTER — Ambulatory Visit
Admission: RE | Admit: 2022-06-02 | Discharge: 2022-06-02 | Disposition: A | Discharge status: Home / Self Care | Payer: Medicare Other | Source: Ambulatory Visit | Attending: Family Medicine | Admitting: Family Medicine

## 2022-06-02 ENCOUNTER — Other Ambulatory Visit: Payer: Self-pay | Admitting: Family Medicine

## 2022-06-02 DIAGNOSIS — M25561 Pain in right knee: Secondary | ICD-10-CM

## 2022-12-14 LAB — HM DIABETES EYE EXAM

## 2023-01-02 ENCOUNTER — Telehealth: Payer: Self-pay | Admitting: Gastroenterology

## 2023-01-02 NOTE — Telephone Encounter (Signed)
Hi Dr. Adela Lank,   We received a call from patient regarding scheduling recall colonoscopy. Patient last had a colonoscopy with you in 2018. Since then patient has seen [facility]. Patient is wishing to transfer her care back to you due to [facility] now being too far for her to travel to have a colonoscopy. Please review and advise on scheduling.    Thank you.

## 2023-01-02 NOTE — Telephone Encounter (Signed)
Okay. If she wants to be directly booked at the [facility] for a colonoscopy we can do that if she otherwise meets criteria for the [facility]. If she wishes to be seen in the office to discuss any symptoms, can book her for an office visit otherwise. Thanks

## 2023-01-03 NOTE — Telephone Encounter (Signed)
Called patient to schedule. Patient answered and phone hung up. Will try again at another time.

## 2023-01-05 ENCOUNTER — Encounter: Payer: Self-pay | Admitting: Gastroenterology

## 2023-04-12 ENCOUNTER — Telehealth: Payer: Self-pay | Admitting: Nurse Practitioner

## 2023-04-12 NOTE — Telephone Encounter (Signed)
Patient is aware of scheduled appointment times/dates for New Patient appointment. Patient has been advised to arrive at least 20 mins prior for registration.

## 2023-04-29 ENCOUNTER — Other Ambulatory Visit: Payer: Self-pay | Admitting: Family Medicine

## 2023-04-30 ENCOUNTER — Encounter: Payer: Self-pay | Admitting: Gastroenterology

## 2023-04-30 ENCOUNTER — Telehealth: Payer: Self-pay

## 2023-04-30 ENCOUNTER — Ambulatory Visit: Payer: Medicare Other | Admitting: Gastroenterology

## 2023-04-30 VITALS — BP 130/80 | HR 68 | Ht 62.0 in | Wt 162.0 lb

## 2023-04-30 DIAGNOSIS — K219 Gastro-esophageal reflux disease without esophagitis: Secondary | ICD-10-CM | POA: Diagnosis not present

## 2023-04-30 DIAGNOSIS — Z860100 Personal history of colon polyps, unspecified: Secondary | ICD-10-CM

## 2023-04-30 DIAGNOSIS — R194 Change in bowel habit: Secondary | ICD-10-CM | POA: Diagnosis not present

## 2023-04-30 MED ORDER — FAMOTIDINE 20 MG PO TABS: 20.0000 mg | ORAL_TABLET | Freq: Every day | ORAL | 3 refills | Status: AC | PRN

## 2023-04-30 MED ORDER — CITRUCEL PO POWD: 1.0000 | Freq: Every day | ORAL | Status: AC

## 2023-04-30 NOTE — Telephone Encounter (Signed)
Received call from [facility] regarding prescription for Metoprolol.   Pharmacist asks for new prescription with directions for how patient is to be taking medication.   Signature on previous prescription indicates to make follow up appointment with PCP.   Please update prescription and resend to [facility].   Thanks.   Veronda Prude, RN

## 2023-04-30 NOTE — Patient Instructions (Signed)
We will request your EGD and colonoscopy report from [facility].  Stop your probiotic.  Please purchase the following medications over the counter and take as directed: Citrucel - use one daily  We have sent the following medications to your pharmacy for you to pick up at your convenience: Pepcid 20 mg: take once daily as needed  Thank you for entrusting me with your care and for choosing [facility], Dr. Ileene Patrick    If your blood pressure at your visit was 140/90 or greater, please contact your primary care physician to follow up on this. ______________________________________________________  If you are age 72 or older, your body mass index should be between 23-30. Your Body mass index is 29.63 kg/m. If this is out of the aforementioned range listed, please consider follow up with your Primary Care Provider.  If you are age 72 or younger, your body mass index should be between 19-25. Your Body mass index is 29.63 kg/m. If this is out of the aformentioned range listed, please consider follow up with your Primary Care Provider.  ________________________________________________________  The [facility] providers would like to encourage you to use [facility] to communicate with providers for non-urgent requests or questions.  Due to long hold times on the telephone, sending your provider a message by [facility] may be a faster and more efficient way to get a response.  Please allow 48 business hours for a response.  Please remember that this is for non-urgent requests.  _______________________________________________________  Due to recent changes in healthcare laws, you may see the results of your imaging and laboratory studies on MyChart before your provider has had a chance to review them.  We understand that in some cases there may be results that are confusing or concerning to you. Not all laboratory results come back in the same time frame and the provider may be waiting for  multiple results in order to interpret others.  Please give [place] 48 hours in order for your provider to thoroughly review all the results before contacting the office for clarification of your results.

## 2023-04-30 NOTE — Progress Notes (Signed)
HPI :  73 year old female with a history of chronic diarrhea, colon polyps, GERD, here to reestablish her GI care.  I saw her previously for screening colonoscopy in 2018, she was seen by digestive health in 2021 for more acute issues.  We have not seen her since 2018.  She states she has had altered bowel habits to some extent over time and things got worse to where she sought evaluation at digestive health in 2021.  She was having what she describes as episodic diarrhea at the time.  She states she had a colonoscopy and told "all was clear", however we do not have that report on file today. Over time her symptoms got better.  Reportedly negative for microscopic colitis.  She had a negative stool workup. Infectious stool studies negative. Stool studies work-up including pancreatic elastase, fecal leukocytes and fecal fat were all unremarkable.  She says that she currently at baseline has a bowel movement once per week or twice per week at baseline.  At this frequency she states about every 2 weeks she will have a day or 2 where she has multiple bowel movements that are loose and then after that timeframe she will go for another week or 2 until the next cycle happens.  She denies any blood in her stools.  She states the stool form at baseline is loose to soft.  She is taking probiotics as needed but nothing else for her bowels on a routine basis.  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  She apparently had an EGD to rule out celiac disease at the same time as her colonoscopy.  Otherwise, she has had GERD for the past few years or so.  She takes omeprazole once or twice a week and that works pretty well to control her symptoms.  No dysphagia.  No reported or known Barrett's.  She states she does have stage III CKD.  We discussed options moving forward for management of that.   Colonoscopy 05/08/2016: - The perianal and digital rectal examinations were  normal. - A 5 mm polyp was found in the ascending colon. The polyp was flat. The polyp was removed with a cold snare. Resection and retrieval were complete. - A 5 mm polyp was found in the transverse colon. The polyp was sessile. The polyp was removed with a cold snare. Resection and retrieval were complete. - A 4 mm polyp was found in the splenic flexure. The polyp was sessile. The polyp was removed with a cold snare. Resection and retrieval were complete. - Internal hemorrhoids were found during retroflexion. - The exam was otherwise without abnormality.  Surgical [P], splenic flexure, transverse and ascending, polyp (3) - TUBULAR ADENOMA (2) AND BENIGN LYMPHOID POLYP (1). NO HIGH GRADE DYSPLASIA OR MALIGNANCY IDENTIFIED.   EGD and colonoscopy reportedly done in 2021 -no records available   Past Medical History:  Diagnosis Date   Anemia    Arthritis    CHF (congestive heart failure) (HCC)    Diabetes mellitus without complication (HCC)    type 2   Family history of adverse reaction to anesthesia    daughter has a hard time waking up   GERD (gastroesophageal reflux disease)    Headache    when she first started with HTN   Heart murmur    has not had any issues   History of kidney stones    Hyperlipidemia    Hyperparathyroidism (HCC)    Hyperplastic colon  polyp    Hypertension    Irregular heart rate    Restless legs      Past Surgical History:  Procedure Laterality Date   COLONOSCOPY     COLONOSCOPY     dental procedure     cyst removed from gum   LUMBAR LAMINECTOMY/DECOMPRESSION MICRODISCECTOMY N/A 11/02/2017   Procedure: Laminectomy and Foraminotomy - Lumbar Three-Lumbar Four - Lumbar Four-Lumbar Five;  Surgeon: Donalee Citrin, MD;  Location: [facility] OR;  Service: Neurosurgery;  Laterality: N/A;  Laminectomy and Foraminotomy - Lumbar Three-Lumbar Four - Lumbar Four-Lumbar Five   PARATHYROIDECTOMY Left 12/29/2016   Procedure: LEFT INFERIOR PARATHYROIDECTOMY;  Surgeon: Darnell Level,  MD;  Location: [facility] OR;  Service: General;  Laterality: Left;   TUBAL LIGATION     Family History  Problem Relation Age of Onset   Hypertension Mother    Alzheimer's disease Mother    Diabetes Father    Hypertension Father    Diabetes Sister    Heart disease Daughter    Diabetes Brother    Heart disease Brother    Diabetes Brother    Cancer Neg Hx        no breast or colon cancer   Colon cancer Neg Hx    Esophageal cancer Neg Hx    Pancreatic cancer Neg Hx    Rectal cancer Neg Hx    Stomach cancer Neg Hx    Breast cancer Neg Hx    Social History   Tobacco Use   Smoking status: Never   Smokeless tobacco: Never  Vaping Use   Vaping status: Never Used  Substance Use Topics   Alcohol use: No   Drug use: No   Current Outpatient Medications  Medication Sig Dispense Refill   amLODipine (NORVASC) 5 MG tablet TAKE ONE TABLET BY MOUTH EVERY NIGHT AT BEDTIME 90 tablet 3   atorvastatin (LIPITOR) 10 MG tablet TAKE ONE TABLET BY MOUTH DAILY 90 tablet 1   cholecalciferol (VITAMIN D3) 25 MCG (1000 UNIT) tablet Take 100 Units by mouth daily.     metFORMIN (GLUCOPHAGE) 500 MG tablet TAKE ONE TABLET BY MOUTH TWICE A DAY WITH MEALS 180 tablet 10   methocarbamol (ROBAXIN-750) 750 MG tablet Take 1 tablet (750 mg total) by mouth 2 (two) times daily as needed for muscle spasms. 20 tablet 0   methylPREDNISolone (MEDROL DOSEPAK) 4 MG TBPK tablet Take as directed 21 tablet 0   metoprolol succinate (TOPROL-XL) 100 MG 24 hr tablet TAKE ONE TABLET BY MOUTH DAILY WITH OR IMMEDIATELY FOLLOWING A MEAL 90 tablet 4   omeprazole (PRILOSEC) 20 MG capsule TAKE ONE CAPSULE BY MOUTH DAILY 30 capsule 0   predniSONE (STERAPRED UNI-PAK 21 TAB) 10 MG (21) TBPK tablet Take as directed 21 tablet 0   traMADol (ULTRAM) 50 MG tablet Take 1 tablet (50 mg total) by mouth every 12 (twelve) hours as needed. 30 tablet 1   triamterene-hydrochlorothiazide (MAXZIDE) 75-50 MG tablet TAKE ONE TABLET BY MOUTH DAILY 30 tablet 0    vitamin B-12 (CYANOCOBALAMIN) 500 MCG tablet Take 500 mcg by mouth daily.     No current facility-administered medications for this visit.   Allergies  Allergen Reactions   Lisinopril Swelling     Angioedema   Pravastatin Other (See Comments)    Severe myalgias   Simvastatin Other (See Comments)    myalgias   Aspirin Other (See Comments)    Was told to never take,not sure why thought it had something to do with BP  Review of Systems: All systems reviewed and negative except where noted in HPI.   Lab Results  Component Value Date   WBC 4.1 08/14/2021   HGB 10.5 (L) 08/14/2021   HCT 32.6 (L) 08/14/2021   MCV 92.9 08/14/2021   PLT 242 08/14/2021    Lab Results  Component Value Date   NA 137 08/14/2021   CL 103 08/14/2021   K 4.4 08/14/2021   CO2 25 08/14/2021   BUN 23 08/14/2021   CREATININE 1.02 (H) 08/14/2021   GFRNONAA 59 (L) 08/14/2021   CALCIUM 9.7 08/14/2021   PHOS 2.2 (L) 04/25/2016   ALBUMIN 4.3 11/08/2016   GLUCOSE 86 08/14/2021    Lab Results  Component Value Date   ALT 21 11/08/2016   AST 19 11/08/2016   ALKPHOS 65 11/08/2016   BILITOT 0.6 11/08/2016     Physical Exam: BP 130/80   Pulse 68   Ht 5\' 2"  (1.575 m)   Wt 162 lb (73.5 kg)   BMI 29.63 kg/m  Constitutional: Pleasant,well-developed, female in no acute distress. HEENT: Normocephalic and atraumatic. Conjunctivae are normal. No scleral icterus. Neck supple.  Cardiovascular: Normal rate, regular rhythm.  Pulmonary/chest: Effort normal and breath sounds normal.  Abdominal: Soft, nondistended, nontender. There are no masses palpable.  Extremities: no edema Lymphadenopathy: No cervical adenopathy noted. Neurological: Alert and oriented to person place and time. Skin: Skin is warm and dry. No rashes noted. Psychiatric: Normal mood and affect. Behavior is normal.   ASSESSMENT: 73 y.o. female here for assessment of the following  1. Altered bowel habits   2. Gastroesophageal reflux  disease, unspecified whether esophagitis present   3. History of colon polyps    Longstanding history of having baseline infrequent stools followed by 1 episode every 2 weeks of multiple stools in a day or so, and then the cycle repeats.  She has had a colonoscopy reportedly negative for microscopic colitis etc. and a negative stool workup.  Sounds like she may more have overflow leading to this although at baseline her stool form is a bit on the looser side.  We discussed options.  I would stop probiotic, does not appear to be helping currently.  Recommend trial of Citrucel once daily to see if this will help prevent some regularity, help her form, and prevent overflow.  She will try this for now.  I would like to get records of her last colonoscopy and EGD to clarify findings.  Otherwise discussed her reflux which appears mild.  Using omeprazole few days a week which works well to control her symptoms.  We did discuss long-term risks and benefits of chronic PPI use.  Given mild intermittent symptoms I think she may do okay with Pepcid and will also reduce risk for CKD.  She will try Pepcid, prescription given to her.  I told her however if Pepcid is not working and she needs something stronger can continue to take omeprazole as needed.  She understands risks of more frequent use if she needs it.   PLAN: - stop probiotic - start Citrucel daily - trial pepcid 20mg  daily PRN #90 R3 - stop omeprazole routinely and tyr pepcid, and only use PRN if pepcid fails - get records of last EGD and colonoscopy from [facility] in 2021 to determine timing of next exam - follow up PRN. She can contact me for further advice pending her course.  Harlin Rain, MD [facility]  CC: Lincoln Brigham, MD

## 2023-05-01 ENCOUNTER — Other Ambulatory Visit: Payer: Self-pay | Admitting: Family Medicine

## 2023-05-01 DIAGNOSIS — I509 Heart failure, unspecified: Secondary | ICD-10-CM

## 2023-05-04 ENCOUNTER — Other Ambulatory Visit: Payer: Self-pay | Admitting: Family Medicine

## 2023-05-04 DIAGNOSIS — Z1231 Encounter for screening mammogram for malignant neoplasm of breast: Secondary | ICD-10-CM

## 2023-05-05 ENCOUNTER — Ambulatory Visit
Admission: RE | Admit: 2023-05-05 | Discharge: 2023-05-05 | Disposition: A | Discharge status: Home / Self Care | Payer: Medicare Other | Source: Ambulatory Visit | Attending: Family Medicine | Admitting: Family Medicine

## 2023-05-05 DIAGNOSIS — Z1231 Encounter for screening mammogram for malignant neoplasm of breast: Secondary | ICD-10-CM

## 2023-05-06 ENCOUNTER — Telehealth: Payer: Self-pay | Admitting: Gastroenterology

## 2023-05-06 NOTE — Telephone Encounter (Signed)
 Colonoscopy results arrived  01/12/20 - [facility] in [facility] - Dr. Garrison Columbus "Excellent prep" = normal ileum and colon - small hemorrhoids  EGD also done - entirely normal, biopsies of stomach and small bowel done  All path normal/ negative  Given she had a colonoscopy with me a few years before that with a few adenomas removed, recommend he next colonoscopy be in 01/2027 for surveillance purposes - Jan if you can place recall. Thanks

## 2023-05-07 ENCOUNTER — Inpatient Hospital Stay: Payer: No Typology Code available for payment source | Attending: Nurse Practitioner | Admitting: Nurse Practitioner

## 2023-05-07 ENCOUNTER — Inpatient Hospital Stay: Payer: No Typology Code available for payment source

## 2023-05-07 VITALS — BP 139/68 | HR 58 | Temp 97.7°F | Resp 18 | Ht 62.0 in | Wt 164.4 lb

## 2023-05-07 DIAGNOSIS — D649 Anemia, unspecified: Secondary | ICD-10-CM | POA: Diagnosis present

## 2023-05-07 DIAGNOSIS — D638 Anemia in other chronic diseases classified elsewhere: Secondary | ICD-10-CM

## 2023-05-07 NOTE — Progress Notes (Unsigned)
 [facility]   Telephone:([phone] Fax:([phone]) [phone]   Clinic New consult Note   Patient Care Team: Lincoln Brigham, MD as PCP - General (Family Medicine) Date of Service: 05/07/2023  CHIEF COMPLAINTS/PURPOSE OF CONSULTATION:  Anemia, referred by PCP Dr. Sherrilee Gilles  HISTORY OF PRESENTING ILLNESS:  Sandra [name] 73 y.o. female with PMH including HTN, HL, DM, arthritis, CHF, CKD is here because of anemia.  She was first found to have a mild normocytic anemia starting 10/23/2012 with Hgb 11.3 which remained stable in the 11 range until 08/14/2021 when Hgb dropped to 10.5.  No other cytopenias. Colonoscopy 05/08/2016 by Dr. Adela Lank showed 3 polyps and internal hemorrhoids.   B12 level low range normal 6 years ago at 215 but up to 502 and 08/14/2021.  Updated colon[facility] in [place] showed "normal ileum and colon, small hemorrhoids, and normal EGD, all path negative" per EPIC notes with recall in 01/2027. Most recent labs from PCP at [facility] 03/29/2023 showed Hgb 10.7, normal MCV, serum iron 55, TIBC 344, 16% saturation, ferritin of 27, CR 1.12, TSH 0.58, Hgb A1c 6.1%. A peripheral smear showed borderline microcytic and hypochromic RBCs, mature neutrophils, and rare atypical lymphocytes.   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  She is up-to-date on age-appropriate cancer screenings and denies alcohol, tobacco, or other drug use.  Father had leukemia and a maternal grandmother had early stage breast cancer.  Today she presents by herself feeling well in general except feels cold often.  She has good energy and appetite with no unintentional weight loss, night sweats, lymphadenopathy.  She tried oral iron for a week or so and developed dark stools so she stopped.  Denies any bleeding, fever/chills, cough, chest pain, dyspnea, pica, pain, or any other specific complaints.  She has never received or donated blood.     MEDICAL HISTORY:  Past Medical History:  Diagnosis Date   Anemia    Arthritis    CHF (congestive heart failure) (HCC)    Diabetes mellitus without complication (HCC)    type 2   Family history of adverse reaction to anesthesia    daughter has a hard time waking up   GERD (gastroesophageal reflux disease)    Headache    when she first started with HTN   Heart murmur    has not had any issues   History of kidney stones    Hyperlipidemia    Hyperparathyroidism (HCC)    Hyperplastic colon polyp    Hypertension    Irregular heart rate    Restless legs     SURGICAL HISTORY: Past Surgical History:  Procedure Laterality Date   COLONOSCOPY     COLONOSCOPY     dental procedure     cyst removed from gum   LUMBAR LAMINECTOMY/DECOMPRESSION MICRODISCECTOMY N/A 11/02/2017   Procedure: Laminectomy and Foraminotomy - Lumbar Three-Lumbar Four - Lumbar Four-Lumbar Five;  Surgeon: Donalee Citrin, MD;  Location: [facility] OR;  Service: Neurosurgery;  Laterality: N/A;  Laminectomy and Foraminotomy - Lumbar Three-Lumbar Four - Lumbar Four-Lumbar Five   PARATHYROIDECTOMY Left 12/29/2016   Procedure: LEFT INFERIOR PARATHYROIDECTOMY;  Surgeon: Darnell Level, MD;  Location: [facility] OR;  Service: General;  Laterality: Left;   TUBAL LIGATION      SOCIAL HISTORY: Social History   Socioeconomic History   Marital status: Married    Spouse name: Not on file   Number of children:  3   Years of education: Not on file   Highest education level: Not on file  Occupational History   Occupation: clean houses  Tobacco Use   Smoking status: Never   Smokeless tobacco: Never  Vaping Use   Vaping status: Never Used  Substance and Sexual Activity   Alcohol use: No   Drug use: No   Sexual activity: Not on file  Other Topics Concern   Not on file  Social History Narrative   Not on file   Social Drivers of Health   Financial Resource Strain: Not on file  Food  Insecurity: Food Insecurity Present (05/07/2023)   Hunger Vital Sign    Worried About Running Out of Food in the Last Year: Sometimes true    Ran Out of Food in the Last Year: Sometimes true  Transportation Needs: No Transportation Needs (05/07/2023)   PRAPARE - Administrator, Civil Service (Medical): No    Lack of Transportation (Non-Medical): No  Physical Activity: Not on file  Stress: Not on file  Social Connections: Unknown (07/26/2021)   [facility] from [facility]   Social Network    Social Network: Not on file  Intimate Partner Violence: Not At Risk (05/07/2023)   Humiliation, Afraid, Rape, and Kick questionnaire    Fear of Current or Ex-Partner: No    Emotionally Abused: No    Physically Abused: No    Sexually Abused: No    FAMILY HISTORY: Family History  Problem Relation Age of Onset   Hypertension Mother    Alzheimer's disease Mother    Diabetes Father    Hypertension Father    Diabetes Sister    Heart disease Daughter    Diabetes Brother    Heart disease Brother    Diabetes Brother    Cancer Neg Hx        no breast or colon cancer   Colon cancer Neg Hx    Esophageal cancer Neg Hx    Pancreatic cancer Neg Hx    Rectal cancer Neg Hx    Stomach cancer Neg Hx    Breast cancer Neg Hx     ALLERGIES:  is allergic to lisinopril, pravastatin, simvastatin, and aspirin.  MEDICATIONS:  Current Outpatient Medications  Medication Sig Dispense Refill   amLODipine (NORVASC) 5 MG tablet TAKE ONE TABLET BY MOUTH EVERY NIGHT AT BEDTIME 90 tablet 3   atorvastatin (LIPITOR) 10 MG tablet TAKE ONE TABLET BY MOUTH DAILY 90 tablet 1   cholecalciferol (VITAMIN D3) 25 MCG (1000 UNIT) tablet Take 100 Units by mouth daily.     cyanocobalamin (VITAMIN B12) 500 MCG tablet Take 1 tablet by mouth daily.     famotidine (PEPCID) 20 MG tablet Take 1 tablet (20 mg total) by mouth daily as needed for heartburn or indigestion. 90 tablet 3   metFORMIN (GLUCOPHAGE) 500  MG tablet TAKE ONE TABLET BY MOUTH TWICE A DAY WITH MEALS 180 tablet 10   methocarbamol (ROBAXIN-750) 750 MG tablet Take 1 tablet (750 mg total) by mouth 2 (two) times daily as needed for muscle spasms. 20 tablet 0   methylcellulose (CITRUCEL) oral powder Take 1 packet by mouth daily.     methylPREDNISolone (MEDROL DOSEPAK) 4 MG TBPK tablet Take as directed 21 tablet 0   metoprolol succinate (TOPROL-XL) 100 MG 24 hr tablet Take 1 tablet daily. Need follow-up appointment with PCP before next refill. 90 tablet 0   predniSONE (STERAPRED UNI-PAK 21 TAB) 10 MG (21) TBPK  tablet Take as directed 21 tablet 0   traMADol (ULTRAM) 50 MG tablet Take 1 tablet (50 mg total) by mouth every 12 (twelve) hours as needed. 30 tablet 1   triamterene-hydrochlorothiazide (MAXZIDE) 75-50 MG tablet TAKE ONE TABLET BY MOUTH DAILY 30 tablet 0   omeprazole (PRILOSEC) 20 MG capsule TAKE ONE CAPSULE BY MOUTH DAILY (Patient not taking: Reported on 05/07/2023) 30 capsule 0   vitamin B-12 (CYANOCOBALAMIN) 500 MCG tablet Take 500 mcg by mouth daily.     No current facility-administered medications for this visit.    REVIEW OF SYSTEMS:   Constitutional: Denies fevers, chills, weight loss, or abnormal night sweats Eyes: Denies blurriness of vision, double vision or watery eyes Ears, nose, mouth, throat, and face: Denies mucositis, sore throat, or epistaxis Respiratory: Denies cough, dyspnea or wheezes Cardiovascular: Denies palpitation, chest discomfort, thrombosis, or lower extremity swelling (+) CHF Gastrointestinal:  Denies nausea, vomiting, constipation, diarrhea, hematochezia, or change in bowel habits (+) GERD Skin: Denies abnormal skin rashes Lymphatics: Denies new lymphadenopathy or easy bruising Neurological:Denies numbness, tingling or new weaknesses Behavioral/Psych: Mood is stable, no new changes   All other systems were reviewed with the patient and are negative.  PHYSICAL EXAMINATION: ECOG PERFORMANCE STATUS: 0  - Asymptomatic  Vitals:   05/07/23 1306  BP: 139/68  Pulse: (!) 58  Resp: 18  Temp: 97.7 F (36.5 C)  SpO2: 100%   Filed Weights   05/07/23 1306  Weight: 164 lb 6.4 oz (74.6 kg)    GENERAL:alert, no distress and comfortable SKIN: no rashes or significant lesions EYES:  sclera clear  NECK: without mass  LYMPH:  no palpable cervical or supraclavicular lymphadenopathy  LUNGS: clear with normal breathing effort HEART: regular rate & rhythm, no lower extremity edema ABDOMEN:abdomen soft, non-tender and normal bowel sounds Musculoskeletal:no cyanosis of digits and no clubbing  PSYCH: alert & oriented x 3 with fluent speech NEURO: no focal motor/sensory deficits  LABORATORY DATA:  I have reviewed the data as listed    Latest Ref Rng & Units 08/14/2021    3:28 PM 10/25/2017   11:00 AM 12/27/2016    9:26 AM  CBC  WBC 4.0 - 10.5 K/uL 4.1  5.2  5.5   Hemoglobin 12.0 - 15.0 g/dL 16.1  09.6  04.5   Hematocrit 36.0 - 46.0 % 32.6  35.6  35.0   Platelets 150 - 400 K/uL 242  343  266        Latest Ref Rng & Units 08/14/2021    3:28 PM 04/16/2019    4:53 PM 04/30/2018   10:05 AM  CMP  Glucose 70 - 99 mg/dL 86  409  811   BUN 8 - 23 mg/dL 23  20  16    Creatinine 0.44 - 1.00 mg/dL 9.14  7.82  9.56   Sodium 135 - 145 mmol/L 137  142  139   Potassium 3.5 - 5.1 mmol/L 4.4  3.8  4.7   Chloride 98 - 111 mmol/L 103  102  99   CO2 22 - 32 mmol/L 25  22  22    Calcium 8.9 - 10.3 mg/dL 9.7  9.6  9.5      RADIOGRAPHIC STUDIES: I have personally reviewed the radiological images as listed and agreed with the findings in the report. MM 3D SCREENING MAMMOGRAM BILATERAL BREAST Result Date: 05/08/2023 CLINICAL DATA:  Screening. EXAM: DIGITAL SCREENING BILATERAL MAMMOGRAM WITH TOMOSYNTHESIS AND CAD TECHNIQUE: Bilateral screening digital craniocaudal and mediolateral oblique mammograms were obtained.  Bilateral screening digital breast tomosynthesis was performed. The images were evaluated with  computer-aided detection. COMPARISON:  Previous exam(s). ACR Breast Density Category b: There are scattered areas of fibroglandular density. FINDINGS: There are no findings suspicious for malignancy. IMPRESSION: No mammographic evidence of malignancy. A result letter of this screening mammogram will be mailed directly to the patient. RECOMMENDATION: Screening mammogram in one year. (Code:SM-B-01Y) BI-RADS CATEGORY  1: Negative. Electronically Signed   By: Hulan Saas M.D.   On: 05/08/2023 14:35    ASSESSMENT & PLAN:  73 year old female   Normocytic anemia  -We reviewed her medical record in detail with the patient.  -Chronic mild anemia Hgb 11 since 2014, which worsened slightly to 10 range in 2023, normal MCV, and no other cytopenias  -B12 mildly low in the past, but has been normal on oral B12 supplement -Colonoscopy/EGD in 2021 were negative for bleeding. She is postmenopausal -Most recent lab 03/2023 showed Hgb 10.7, normal MCV, serum iron 55, TIBC 344, 16% saturation, ferritin of 27, Retic 2%, SCr 1.12, TSH 0.58, Hgb A1c 6.1%. A peripheral smear showed borderline microcytic and hypochromic RBCs, mature neutrophils, and rare atypical lymphocytes. PCP did thorough work up [employer] are consistent with what is likely anemia of chronic disease and mild component of iron deficiency secondary to low absorption, given that she takes oral iron with PPI  -We recommend to take oral iron for 2 more months then repeat CBC, ferritin, and iron/TIBC at next PCP visit -She understands if anemia worsens to </= 9 we will recommend additional work up likely including bone marrow biopsy to rule out MDS, if confirmed she would be a candidate for ESA injections -However, the suspicion for primary bone marrow condition is low, given the mild and chronic nature of her anemia and no other cytopenias at this time -Pt prefers to have next labs at PCP in 2 months, will ask to check CBC with diff, Iron/TIBC, and ferritin.   -Lab and follow up with [place] in 6 months -Patient seen with Dr. Mosetta Putt   PLAN: -Medical record reviewed -Continue oral iron with vit C source and separate from PPI for additional 2 months -CBC w diff, Iron/TIBC, and ferritin at PCP in 2 months -Lab and follow up in 6 months, or sooner if needed  -Pt seen with Dr. Mosetta Putt   Orders Placed This Encounter  Procedures   CBC with Differential (Cancer Center Only)    Standing Status:   Future    Expiration Date:   05/07/2024   CMP (Cancer Center only)    Standing Status:   Future    Expiration Date:   05/07/2024   Ferritin    Standing Status:   Future    Expiration Date:   05/07/2024   Iron and Iron Binding Capacity ([facility] only)    Standing Status:   Future    Expiration Date:   05/07/2024      All questions were answered. The patient knows to call the clinic with any problems, questions or concerns.      Pollyann Samples, NP 05/08/23   Addendum I have seen the patient, examined her. I agree with the assessment and and plan and have edited the notes.   73 year old female with past medical history of hypertension, diabetes, CHF and CKD, was referred for chronic normocytic anemia.  She has not had anemia for about 10 years, slightly worse lately.  Her recent iron study and B12 level were normal.  This is  likely anemia of chronic disease due to her CKD and other medical comorbidities.  Feelings of chronic mild anemia over 10 years, my suspicion for MDS and other primary pulmonary disease is low, I do not think she needs a bone marrow biopsy.  I recommend her to try prenatal vitamin or over-the-counter oral iron, and monitor her CBC every 3 to 6 months.  We discussed the indication, benefit and potential side effect of ESA injection if she has worsening anemia with hemoglobin persistently less than 9.  All questions were answered.  I spent a total of 30 minutes for her visit today, more than 50% time on face-to-face counseling.  Malachy Mood  MD 05/07/2023

## 2023-05-07 NOTE — Telephone Encounter (Signed)
 Recall colon placed for 01-2027

## 2023-05-08 ENCOUNTER — Encounter: Payer: Self-pay | Admitting: Nurse Practitioner

## 2023-07-23 ENCOUNTER — Other Ambulatory Visit: Payer: Self-pay | Admitting: Family Medicine

## 2023-07-23 DIAGNOSIS — M810 Age-related osteoporosis without current pathological fracture: Secondary | ICD-10-CM

## 2023-11-04 ENCOUNTER — Other Ambulatory Visit: Payer: Self-pay | Admitting: Nurse Practitioner

## 2023-11-04 DIAGNOSIS — D638 Anemia in other chronic diseases classified elsewhere: Secondary | ICD-10-CM

## 2023-11-04 NOTE — Assessment & Plan Note (Signed)
 73 y.o. female with PMH including HTN, HL, DM, arthritis, CHF, CKD is here because of anemia.  She was first found to have a mild normocytic anemia starting 10/23/2012 with Hgb 11.3 which remained stable in the 11 range until 08/14/2021 when Hgb dropped to 10.5.  No other cytopenias. Colonoscopy 05/08/2016 by Dr. Leigh showed 3 polyps and internal hemorrhoids.   B12 level low range normal 6 years ago at 215 but up to 502 and 08/14/2021.  Updated [facility] [place] showed normal ileum and colon, small hemorrhoids, and normal EGD, all path negative per EPIC notes with recall in 01/2027. Most recent labs from PCP at [facility] 03/29/2023 showed Hgb 10.7, normal MCV, serum iron 55, TIBC 344, 16% saturation, ferritin of 27, CR 1.12, TSH 0.58, Hgb A1c 6.1%. A peripheral smear showed borderline microcytic and hypochromic RBCs, mature neutrophils, and rare atypical lymphocytes.  It was recommend she start taking oral iron for 2 more months then repeat CBC, ferritin, and iron/TIBC at next PCP visit -She understands if anemia worsens to </= 9 we will recommend additional work up likely including bone marrow biopsy to rule out MDS, if confirmed she would be a candidate for ESA injections -However, the suspicion for primary bone marrow condition is low, given the mild and chronic nature of her anemia and no other cytopenias at this time -11/05/2023 - Hgb and Hct continue to improve. Hgb is 11.2 and Hct 33.9. recommend she restart oral b12. She can take every other day if she tolerates this better than daily. Awaiting results of ferritin and iron panels. May consider adding oral iron.  --expect labs and follow up in 6 months, sooner if needed.

## 2023-11-04 NOTE — Progress Notes (Unsigned)
 Patient Care Team: Elicia Hamlet, MD as PCP - General (Family Medicine)  Clinic Day:  11/05/2023  Referring physician: Elicia Hamlet, MD  ASSESSMENT & PLAN:   Assessment & Plan: Anemia 73 y.o. female with PMH including HTN, HL, DM, arthritis, CHF, CKD is here because of anemia.  She was first found to have a mild normocytic anemia starting 10/23/2012 with Hgb 11.3 which remained stable in the 11 range until 08/14/2021 when Hgb dropped to 10.5.  No other cytopenias. Colonoscopy 05/08/2016 by Dr. Leigh showed 3 polyps and internal hemorrhoids.   B12 level low range normal 6 years ago at 215 but up to 502 and 08/14/2021.  Updated colonoscopy [facility]/12/2020 [facility] [place] showed normal ileum and colon, small hemorrhoids, and normal EGD, all path negative per EPIC notes with recall in 01/2027. Most recent labs from PCP at [facility] 03/29/2023 showed Hgb 10.7, normal MCV, serum iron 55, TIBC 344, 16% saturation, ferritin of 27, CR 1.12, TSH 0.58, Hgb A1c 6.1%. A peripheral smear showed borderline microcytic and hypochromic RBCs, mature neutrophils, and rare atypical lymphocytes.  It was recommend she start taking oral iron for 2 more months then repeat CBC, ferritin, and iron/TIBC at next PCP visit -She understands if anemia worsens to </= 9 we will recommend additional work up likely including bone marrow biopsy to rule out MDS, if confirmed she would be a candidate for ESA injections -However, the suspicion for primary bone marrow condition is low, given the mild and chronic nature of her anemia and no other cytopenias at this time -11/05/2023 - Hgb and Hct continue to improve. Hgb is 11.2 and Hct 33.9. recommend she restart oral b12. She can take every other day if she tolerates this better than daily. Awaiting results of ferritin and iron panels. May consider adding oral iron.  --expect labs and follow up in 6 months, sooner if needed.    Anemia of chronic  disease Was recommended she start taking oral iron supplements. She was taking oral B12. She states that she never started the oral iron and stopped the B12 when her stools turned very dark in color. She states that this resolved shortly after she stopped the oral b12. Her Hgb and Hct have improved to 11.2 and 33.9, respectively. I did recommend that she restart the oral b12. If she feels uncomfortable taking every day, advised she try taking it every other day. Ferritin and iron panels are pending.   Diabetes and CKD Patient reports good control of blood sugar. Her glucose today is 1.5, non fasting. Kidney functions have improved with normal BUN and creatinine, and eGFR of 60. Recommend continued good management of blood sugar. Continue visits with primary care every 3 months for continuing evaluation.   Plan Labs reviewed.  -improving anemia with Hgb 11.2 and Hct 33.9. recommend she restart Vitamin B12 every other day to boost blood count.  -improving blood glucose and renal functions. Monitored closely by primary care.  Awaiting results of ferritin and iron panel.  Labs also monitored by pcp every 3 months.  Plan for labs and follow up in 6 months, sooner if needed .  The patient understands the plans discussed today and is in agreement with them.  She knows to contact our office if she develops concerns prior to her next appointment.  I provided 20 minutes of face-to-face time during this encounter and > 50% was spent counseling as documented under my assessment and plan.    Sandra  E Demorris Choyce, NP  [facility] - A DEPT OF [facility] [address] [place] [address] Dept: [phone] Dept Fax: [phone]   No orders of the defined types were placed in this encounter.     CHIEF COMPLAINT:  CC: Anemia in chronic disease  Current Treatment:  oral iron supplement   INTERVAL HISTORY:  Sandra [name] is here today for  repeat clinical assessment. She last saw Lacie, NP, on 05/07/2023. Anemia was mild to moderate. She had labs rechecked by her primary care in the interval time. Slowly improving anemia and CKD. She reports doing well in general. She is no longer taking b12 supplement and never started taking oral iron. She states that her stools started to become very dark and she stopped taking the B12. She reports that her stools have returned to normal color and consistency. She states that she has noted some mild and intermittent lightheadedness. Plans to mention this to her primary care at her next visit. She denies chest pain, chest pressure, or shortness of breath. She denies headaches or visual disturbances. She denies abdominal pain, nausea, vomiting, or changes in bowel or bladder habits.  She denies fevers or chills. She denies pain. Her appetite is good. Her weight has decreased 3 pounds over last 6 months .  I have reviewed the past medical history, past surgical history, social history and family history with the patient and they are unchanged from previous note.  ALLERGIES:  is allergic to lisinopril, pravastatin , simvastatin , and aspirin.  MEDICATIONS:  Current Outpatient Medications  Medication Sig Dispense Refill   amLODipine  (NORVASC ) 5 MG tablet TAKE ONE TABLET BY MOUTH EVERY NIGHT AT BEDTIME 90 tablet 3   atorvastatin  (LIPITOR) 10 MG tablet TAKE ONE TABLET BY MOUTH DAILY 90 tablet 1   cholecalciferol (VITAMIN D3) 25 MCG (1000 UNIT) tablet Take 100 Units by mouth daily.     cyanocobalamin  (VITAMIN B12) 500 MCG tablet Take 1 tablet by mouth daily.     famotidine  (PEPCID ) 20 MG tablet Take 1 tablet (20 mg total) by mouth daily as needed for heartburn or indigestion. 90 tablet 3   metFORMIN  (GLUCOPHAGE ) 500 MG tablet TAKE ONE TABLET BY MOUTH TWICE A DAY WITH MEALS 180 tablet 10   methocarbamol  (ROBAXIN -750) 750 MG tablet Take 1 tablet (750 mg total) by mouth 2 (two) times daily as needed for muscle  spasms. 20 tablet 0   methylcellulose (CITRUCEL) oral powder Take 1 packet by mouth daily.     methylPREDNISolone  (MEDROL  DOSEPAK) 4 MG TBPK tablet Take as directed 21 tablet 0   metoprolol  succinate (TOPROL -XL) 100 MG 24 hr tablet Take 1 tablet daily. Need follow-up appointment with PCP before next refill. 90 tablet 0   omeprazole  (PRILOSEC) 20 MG capsule TAKE ONE CAPSULE BY MOUTH DAILY (Patient not taking: Reported on 05/07/2023) 30 capsule 0   predniSONE  (STERAPRED UNI-PAK 21 TAB) 10 MG (21) TBPK tablet Take as directed 21 tablet 0   traMADol  (ULTRAM ) 50 MG tablet Take 1 tablet (50 mg total) by mouth every 12 (twelve) hours as needed. 30 tablet 1   triamterene -hydrochlorothiazide  (MAXZIDE ) 75-50 MG tablet TAKE ONE TABLET BY MOUTH DAILY 30 tablet 0   vitamin B-12 (CYANOCOBALAMIN ) 500 MCG tablet Take 500 mcg by mouth daily.     No current facility-administered medications for this visit.     REVIEW OF SYSTEMS:   Constitutional: Denies fevers, chills or abnormal weight loss Eyes:  Denies blurriness of vision Ears, nose, mouth, throat, and face: Denies mucositis or sore throat Respiratory: Denies cough, dyspnea or wheezes Cardiovascular: Denies palpitation, chest discomfort or lower extremity swelling Gastrointestinal:  Denies nausea, heartburn or change in bowel habits Skin: Denies abnormal skin rashes Lymphatics: Denies new lymphadenopathy or easy bruising Neurological:Denies numbness, tingling or new weaknesses. Has mild and intermittent sensation of lightheadedness.  Behavioral/Psych: Mood is stable, no new changes  All other systems were reviewed with the patient and are negative.   VITALS:   Today's Vitals   11/05/23 0947  BP: 138/88  Pulse: (!) 53  Resp: 17  Temp: 98.1 F (36.7 C)  SpO2: 99%  Weight: 161 lb 1.6 oz (73.1 kg)   Body mass index is 29.47 kg/m.   Wt Readings from Last 3 Encounters:  11/05/23 161 lb 1.6 oz (73.1 kg)  05/07/23 164 lb 6.4 oz (74.6 kg)   04/30/23 162 lb (73.5 kg)    Body mass index is 29.47 kg/m.  Performance status (ECOG): 1 - Symptomatic but completely ambulatory  PHYSICAL EXAM:   GENERAL:alert, no distress and comfortable SKIN: skin color, texture, turgor are normal, no rashes or significant lesions EYES: normal, Conjunctiva are pink and non-injected, sclera clear OROPHARYNX:no exudate, no erythema and lips, buccal mucosa, and tongue normal  NECK: supple, thyroid  normal size, non-tender, without nodularity LYMPH:  no palpable lymphadenopathy in the cervical, axillary or inguinal LUNGS: clear to auscultation and percussion with normal breathing effort HEART: regular rate & rhythm and no murmurs and no lower extremity edema ABDOMEN:abdomen soft, non-tender and normal bowel sounds Musculoskeletal:no cyanosis of digits and no clubbing  NEURO: alert & oriented x 3 with fluent speech, no focal motor/sensory deficits  LABORATORY DATA:  I have reviewed the data as listed    Component Value Date/Time   NA 138 11/05/2023 0924   NA 142 04/16/2019 1653   K 3.9 11/05/2023 0924   CL 104 11/05/2023 0924   CO2 27 11/05/2023 0924   GLUCOSE 105 (H) 11/05/2023 0924   BUN 19 11/05/2023 0924   BUN 20 04/16/2019 1653   CREATININE 1.00 11/05/2023 0924   CALCIUM  9.9 11/05/2023 0924   CALCIUM  10.3 01/22/2009 2216   PROT 7.3 11/05/2023 0924   ALBUMIN 4.3 11/05/2023 0924   AST 15 11/05/2023 0924   ALT 10 11/05/2023 0924   ALKPHOS 63 11/05/2023 0924   BILITOT 0.7 11/05/2023 0924   GFRNONAA 60 (L) 11/05/2023 0924   GFRAA 61 04/16/2019 1653    Lab Results  Component Value Date   WBC 4.7 11/05/2023   NEUTROABS 2.9 11/05/2023   HGB 11.2 (L) 11/05/2023   HCT 33.9 (L) 11/05/2023   MCV 90.9 11/05/2023   PLT 267 11/05/2023

## 2023-11-05 ENCOUNTER — Inpatient Hospital Stay: Payer: Medicare Other | Attending: Nurse Practitioner

## 2023-11-05 ENCOUNTER — Inpatient Hospital Stay (HOSPITAL_BASED_OUTPATIENT_CLINIC_OR_DEPARTMENT_OTHER): Payer: Medicare Other | Admitting: Nurse Practitioner

## 2023-11-05 ENCOUNTER — Encounter: Payer: Self-pay | Admitting: Nurse Practitioner

## 2023-11-05 VITALS — BP 138/88 | HR 53 | Temp 98.1°F | Resp 17 | Wt 161.1 lb

## 2023-11-05 DIAGNOSIS — D649 Anemia, unspecified: Secondary | ICD-10-CM

## 2023-11-05 DIAGNOSIS — D631 Anemia in chronic kidney disease: Secondary | ICD-10-CM | POA: Diagnosis not present

## 2023-11-05 DIAGNOSIS — I130 Hypertensive heart and chronic kidney disease with heart failure and stage 1 through stage 4 chronic kidney disease, or unspecified chronic kidney disease: Secondary | ICD-10-CM | POA: Diagnosis not present

## 2023-11-05 DIAGNOSIS — I509 Heart failure, unspecified: Secondary | ICD-10-CM | POA: Insufficient documentation

## 2023-11-05 DIAGNOSIS — E1122 Type 2 diabetes mellitus with diabetic chronic kidney disease: Secondary | ICD-10-CM | POA: Diagnosis present

## 2023-11-05 DIAGNOSIS — N189 Chronic kidney disease, unspecified: Secondary | ICD-10-CM | POA: Diagnosis present

## 2023-11-05 DIAGNOSIS — D638 Anemia in other chronic diseases classified elsewhere: Secondary | ICD-10-CM

## 2023-11-05 LAB — CMP (CANCER CENTER ONLY)
ALT: 10 U/L (ref 0–44)
AST: 15 U/L (ref 15–41)
Albumin: 4.3 g/dL (ref 3.5–5.0)
Alkaline Phosphatase: 63 U/L (ref 38–126)
Anion gap: 7 (ref 5–15)
BUN: 19 mg/dL (ref 8–23)
CO2: 27 mmol/L (ref 22–32)
Calcium: 9.9 mg/dL (ref 8.9–10.3)
Chloride: 104 mmol/L (ref 98–111)
Creatinine: 1 mg/dL (ref 0.44–1.00)
GFR, Estimated: 60 mL/min — ABNORMAL LOW (ref >60)
Glucose, Bld: 105 mg/dL — ABNORMAL HIGH (ref 70–99)
Potassium: 3.9 mmol/L (ref 3.5–5.1)
Sodium: 138 mmol/L (ref 135–145)
Total Bilirubin: 0.7 mg/dL (ref 0.0–1.2)
Total Protein: 7.3 g/dL (ref 6.5–8.1)

## 2023-11-05 LAB — CBC WITH DIFFERENTIAL (CANCER CENTER ONLY)
Abs Immature Granulocytes: 0.01 K/uL (ref 0.00–0.07)
Basophils Absolute: 0 K/uL (ref 0.0–0.1)
Basophils Relative: 0 %
Eosinophils Absolute: 0.1 K/uL (ref 0.0–0.5)
Eosinophils Relative: 2 %
HCT: 33.9 % — ABNORMAL LOW (ref 36.0–46.0)
Hemoglobin: 11.2 g/dL — ABNORMAL LOW (ref 12.0–15.0)
Immature Granulocytes: 0 %
Lymphocytes Relative: 26 %
Lymphs Abs: 1.2 K/uL (ref 0.7–4.0)
MCH: 30 pg (ref 26.0–34.0)
MCHC: 33 g/dL (ref 30.0–36.0)
MCV: 90.9 fL (ref 80.0–100.0)
Monocytes Absolute: 0.5 K/uL (ref 0.1–1.0)
Monocytes Relative: 10 %
Neutro Abs: 2.9 K/uL (ref 1.7–7.7)
Neutrophils Relative %: 62 %
Platelet Count: 267 K/uL (ref 150–400)
RBC: 3.73 MIL/uL — ABNORMAL LOW (ref 3.87–5.11)
RDW: 13.3 % (ref 11.5–15.5)
WBC Count: 4.7 K/uL (ref 4.0–10.5)
nRBC: 0 % (ref 0.0–0.2)

## 2023-11-05 LAB — IRON AND IRON BINDING CAPACITY (CC-WL,HP ONLY)
Iron: 68 ug/dL (ref 28–170)
Saturation Ratios: 18 % (ref 10.4–31.8)
TIBC: 389 ug/dL (ref 250–450)
UIBC: 321 ug/dL (ref 148–442)

## 2023-11-05 LAB — FERRITIN: Ferritin: 48 ng/mL (ref 11–307)

## 2024-02-28 ENCOUNTER — Ambulatory Visit: Admission: RE | Admit: 2024-02-28 | Source: Ambulatory Visit

## 2024-02-28 ENCOUNTER — Other Ambulatory Visit: Payer: Self-pay | Admitting: Family Medicine

## 2024-02-28 DIAGNOSIS — M25512 Pain in left shoulder: Secondary | ICD-10-CM

## 2024-05-05 ENCOUNTER — Other Ambulatory Visit

## 2024-05-05 ENCOUNTER — Ambulatory Visit: Admitting: Nurse Practitioner

## 2024-07-03 ENCOUNTER — Other Ambulatory Visit (HOSPITAL_BASED_OUTPATIENT_CLINIC_OR_DEPARTMENT_OTHER)
